# Patient Record
Sex: Female | Born: 1945 | Race: White | Hispanic: No | Marital: Married | Smoking: Former smoker
Health system: Southern US, Community
[De-identification: ages and names within clinical notes are randomized; demographics above are authoritative.]

## PROBLEM LIST (undated history)

## (undated) DIAGNOSIS — E7800 Pure hypercholesterolemia, unspecified: Secondary | ICD-10-CM

## (undated) DIAGNOSIS — G43909 Migraine, unspecified, not intractable, without status migrainosus: Secondary | ICD-10-CM

## (undated) DIAGNOSIS — F419 Anxiety disorder, unspecified: Secondary | ICD-10-CM

## (undated) DIAGNOSIS — I10 Essential (primary) hypertension: Secondary | ICD-10-CM

## (undated) DIAGNOSIS — J449 Chronic obstructive pulmonary disease, unspecified: Secondary | ICD-10-CM

## (undated) HISTORY — PX: BACK SURGERY: SHX140

## (undated) HISTORY — DX: Pure hypercholesterolemia, unspecified: E78.00

## (undated) HISTORY — PX: TOTAL ABDOMINAL HYSTERECTOMY: SHX209

## (undated) HISTORY — DX: Anxiety disorder, unspecified: F41.9

## (undated) HISTORY — DX: Migraine, unspecified, not intractable, without status migrainosus: G43.909

---

## 2008-06-12 ENCOUNTER — Emergency Department (HOSPITAL_COMMUNITY): Admission: EM | Admit: 2008-06-12 | Discharge: 2008-06-12 | Payer: Self-pay | Admitting: Emergency Medicine

## 2011-06-04 ENCOUNTER — Other Ambulatory Visit (HOSPITAL_COMMUNITY): Payer: Self-pay | Admitting: Family Medicine

## 2011-06-04 DIAGNOSIS — N6459 Other signs and symptoms in breast: Secondary | ICD-10-CM

## 2011-06-26 ENCOUNTER — Ambulatory Visit (HOSPITAL_COMMUNITY)
Admission: RE | Admit: 2011-06-26 | Discharge: 2011-06-26 | Disposition: A | Discharge status: Home / Self Care | Payer: Medicare Other | Source: Ambulatory Visit | Attending: Family Medicine | Admitting: Family Medicine

## 2011-06-26 DIAGNOSIS — N6459 Other signs and symptoms in breast: Secondary | ICD-10-CM

## 2011-06-26 DIAGNOSIS — N63 Unspecified lump in unspecified breast: Secondary | ICD-10-CM | POA: Insufficient documentation

## 2014-09-25 ENCOUNTER — Emergency Department: Payer: Self-pay | Admitting: Emergency Medicine

## 2014-10-10 ENCOUNTER — Ambulatory Visit (INDEPENDENT_AMBULATORY_CARE_PROVIDER_SITE_OTHER): Payer: 59 | Admitting: Diagnostic Neuroimaging

## 2014-10-10 ENCOUNTER — Encounter: Payer: Self-pay | Admitting: Diagnostic Neuroimaging

## 2014-10-10 VITALS — BP 125/81 | HR 86 | Temp 97.9°F | Ht 61.0 in | Wt 153.2 lb

## 2014-10-10 DIAGNOSIS — M5442 Lumbago with sciatica, left side: Secondary | ICD-10-CM

## 2014-10-10 DIAGNOSIS — M5416 Radiculopathy, lumbar region: Secondary | ICD-10-CM

## 2014-10-10 NOTE — Patient Instructions (Signed)
I will setup MRI and physical therapy.

## 2014-10-10 NOTE — Progress Notes (Signed)
[facility]  PATIENT: Samantha Carey DOB: 19-May-1946  REFERRING CLINICIAN: Coletta HISTORY FROM: patient and daughter  REASON FOR VISIT: new consult    HISTORICAL  CHIEF COMPLAINT:  Chief Complaint  Patient presents with  . New Evaluation    neuralgia in left leg     HISTORY OF PRESENT ILLNESS:   69 year old right-handed female here for evaluation of low back pain. April 2015 patient was lifting cinder blocks at home, stacking them 5 feet high, when all of a sudden she had significant low back pain radiating to her left leg. Patient has numbness and tingling from her low back, into the left leg down to the ankle. No significant symptoms in her toes or feet. Symptoms fluctuate throughout the day, typically in the early morning when she gets out of bed. Patient has not been able to do her normal activities of daily living such as lifting, shopping, driving, house chores. Patient went to the emergency room twice and also to walk-in clinic. She has been on gabapentin 300 mg 3 times a day, Norco 10/325 2 times a day, with mild relief. She previously was on tramadol which did not help. She has not had physical therapy. She had x-ray of the lower back in May 2015. No MRI or CT of the lumbar spine.  8 years ago patient had a different type of low back problem, when she was at work, bending and picking up things, leading to severe low back pain. At that time she did not have radiating pain symptoms.  No pain in her right leg. No bowel or bladder contents. No problems with her arms, neck or face.    REVIEW OF SYSTEMS: Full 14 system review of systems performed and notable only for anxiety. Otherwise negative.  ALLERGIES: Allergies  Allergen Reactions  . Morphine And Related Nausea And Vomiting    HOME MEDICATIONS: No outpatient prescriptions prior to visit.   No facility-administered medications prior to visit.    PAST MEDICAL HISTORY: Past Medical History    Diagnosis Date  . Anxiety   . Migraine   . Hypercholesteremia     PAST SURGICAL HISTORY: Past Surgical History  Procedure Laterality Date  . Total abdominal hysterectomy      FAMILY HISTORY: Family History  Problem Relation Age of Onset  . Stroke Mother   . Stroke Maternal Grandmother   . Diabetes Son   . Liver disease Father     alocholic liver cirrhosis  . Liver cancer Father     SOCIAL HISTORY:  History   Social History  . Marital Status: Married    Spouse Name: Hank    Number of Children: 3  . Years of Education: 8   Occupational History  . disability    Social History Main Topics  . Smoking status: Current Every Day Smoker -- 0.50 packs/day    Types: Cigarettes  . Smokeless tobacco: Never Used  . Alcohol Use: No  . Drug Use: No  . Sexual Activity: Not on file   Other Topics Concern  . Not on file   Social History Narrative   Lives with husband Hank but recently moved in with daughter to help with ADLs   Drinks a small amount of caffeine daily     PHYSICAL EXAM  Filed Vitals:   10/10/14 1052  BP: 125/81  Pulse: 86  Temp: 97.9 F (36.6 C)  TempSrc: Oral  Height:  (1.549 m)  Weight: 153 lb 3.2 oz (69.491  kg)    Body mass index is 28.96 kg/(m^2).   Visual Acuity Screening   Right eye Left eye Both eyes  Without correction: 20/40 20/30   With correction:       No flowsheet data found.  GENERAL EXAM: Patient is in no distress; well developed, nourished and groomed; neck is supple  CARDIOVASCULAR: Regular rate and rhythm, no murmurs, no carotid bruits  NEUROLOGIC: MENTAL STATUS: awake, alert, oriented to person, place and time, recent and remote memory intact, normal attention and concentration, language fluent, comprehension intact, naming intact, fund of knowledge appropriate CRANIAL NERVE: no papilledema on fundoscopic exam, pupils equal and reactive to light, visual fields full to confrontation, extraocular muscles intact,  no nystagmus, facial sensation and strength symmetric, hearing intact, palate elevates symmetrically, uvula midline, shoulder shrug symmetric, tongue midline. MOTOR: normal bulk and tone, full strength in the BUE, BLE SENSORY: normal and symmetric to light touch, pinprick, temperature, vibration; STRAIGHT LEG RAISE POSITIVE ON LEFT COORDINATION: finger-nose-finger, fine finger movements normal REFLEXES: deep tendon reflexes present and symmetric; DOWN GOING TOES GAIT/STATION: narrow based gait; able to walk on toes, heels and tandem; romberg is negative    DIAGNOSTIC DATA (LABS, IMAGING, TESTING) - I reviewed patient records, labs, notes, testing and imaging myself where available.  No results found for: WBC, HGB, HCT, MCV, PLT No results found for: NA, K, CL, CO2, GLUCOSE, BUN, CREATININE, CALCIUM, PROT, ALBUMIN, AST, ALT, ALKPHOS, BILITOT, GFRNONAA, GFRAA No results found for: CHOL, HDL, LDLCALC, LDLDIRECT, TRIG, CHOLHDL No results found for: ZOXW9UHGBA1C No results found for: VITAMINB12 No results found for: TSH     ASSESSMENT AND PLAN  69 y.o. year old female here with low back pain graded into the left leg. History and exam suspicious for left lumbar radiculopathy (L5, S1).   PLAN: - MRI lumbar spine - Physical therapy - Continue gabapentin and Norco; if patient will require long-term narcotics then will need to be managed by PCP or pain mgmt clinic   Orders Placed This Encounter  Procedures  . MR Lumbar Spine Wo Contrast  . Ambulatory referral to Physical Therapy   Return in about 3 months (around 01/08/2015).    Suanne MarkerVIKRAM R. Jashon Ishida, MD 10/10/2014, 11:42 AM Certified in Neurology, Neurophysiology and Neuroimaging  [facility] [address], [address] [place], [place] [address] [phone]

## 2014-10-24 ENCOUNTER — Ambulatory Visit
Admission: RE | Admit: 2014-10-24 | Discharge: 2014-10-24 | Disposition: A | Discharge status: Home / Self Care | Payer: Commercial Managed Care - HMO | Source: Ambulatory Visit | Attending: Diagnostic Neuroimaging | Admitting: Diagnostic Neuroimaging

## 2014-10-24 DIAGNOSIS — M5442 Lumbago with sciatica, left side: Secondary | ICD-10-CM | POA: Diagnosis not present

## 2014-10-24 DIAGNOSIS — M5416 Radiculopathy, lumbar region: Secondary | ICD-10-CM | POA: Diagnosis not present

## 2014-10-26 ENCOUNTER — Telehealth: Payer: Self-pay | Admitting: Diagnostic Neuroimaging

## 2014-10-26 DIAGNOSIS — M5416 Radiculopathy, lumbar region: Secondary | ICD-10-CM

## 2014-10-26 NOTE — Telephone Encounter (Signed)
i called patient's daughter with MRI results. Left L4 and L5 pinched nerves likely. Patient having severe pain. Will setup surgical / pain referral with neurosurgery clinic. -VRP

## 2014-10-26 NOTE — Telephone Encounter (Signed)
Patient's daughter Marylene Landngela is calling to get MRI results. Patient is in a lot of pain. Please call. Thank you.

## 2015-01-09 ENCOUNTER — Ambulatory Visit: Payer: 59 | Admitting: Diagnostic Neuroimaging

## 2015-01-10 ENCOUNTER — Encounter: Payer: Self-pay | Admitting: Diagnostic Neuroimaging

## 2016-05-15 ENCOUNTER — Emergency Department (HOSPITAL_COMMUNITY): Payer: Medicare HMO

## 2016-05-15 ENCOUNTER — Encounter (HOSPITAL_COMMUNITY): Payer: Self-pay | Admitting: Emergency Medicine

## 2016-05-15 ENCOUNTER — Emergency Department (HOSPITAL_COMMUNITY)
Admission: EM | Admit: 2016-05-15 | Discharge: 2016-05-15 | Disposition: A | Discharge status: Home / Self Care | Payer: Medicare HMO | Attending: Emergency Medicine | Admitting: Emergency Medicine

## 2016-05-15 DIAGNOSIS — Z79899 Other long term (current) drug therapy: Secondary | ICD-10-CM | POA: Insufficient documentation

## 2016-05-15 DIAGNOSIS — F17210 Nicotine dependence, cigarettes, uncomplicated: Secondary | ICD-10-CM | POA: Diagnosis not present

## 2016-05-15 DIAGNOSIS — R079 Chest pain, unspecified: Secondary | ICD-10-CM | POA: Insufficient documentation

## 2016-05-15 DIAGNOSIS — Z791 Long term (current) use of non-steroidal anti-inflammatories (NSAID): Secondary | ICD-10-CM | POA: Insufficient documentation

## 2016-05-15 DIAGNOSIS — N390 Urinary tract infection, site not specified: Secondary | ICD-10-CM | POA: Diagnosis not present

## 2016-05-15 DIAGNOSIS — R05 Cough: Secondary | ICD-10-CM | POA: Diagnosis present

## 2016-05-15 LAB — COMPREHENSIVE METABOLIC PANEL
ALBUMIN: 3.9 g/dL (ref 3.5–5.0)
ALK PHOS: 81 U/L (ref 38–126)
ALT: 28 U/L (ref 14–54)
ANION GAP: 12 (ref 5–15)
AST: 18 U/L (ref 15–41)
BUN: 51 mg/dL — AB (ref 6–20)
CALCIUM: 9.5 mg/dL (ref 8.9–10.3)
CO2: 26 mmol/L (ref 22–32)
Chloride: 89 mmol/L — ABNORMAL LOW (ref 101–111)
Creatinine, Ser: 2.02 mg/dL — ABNORMAL HIGH (ref 0.44–1.00)
GFR calc Af Amer: 28 mL/min — ABNORMAL LOW (ref >60)
GFR calc non Af Amer: 24 mL/min — ABNORMAL LOW (ref >60)
GLUCOSE: 108 mg/dL — AB (ref 65–99)
Potassium: 3.3 mmol/L — ABNORMAL LOW (ref 3.5–5.1)
SODIUM: 127 mmol/L — AB (ref 135–145)
Total Bilirubin: 0.5 mg/dL (ref 0.3–1.2)
Total Protein: 6.6 g/dL (ref 6.5–8.1)

## 2016-05-15 LAB — CBC WITH DIFFERENTIAL/PLATELET
BASOS ABS: 0 10*3/uL (ref 0.0–0.1)
BASOS PCT: 0 %
EOS ABS: 0.2 10*3/uL (ref 0.0–0.7)
Eosinophils Relative: 1 %
HEMATOCRIT: 40.5 % (ref 36.0–46.0)
HEMOGLOBIN: 14 g/dL (ref 12.0–15.0)
Lymphocytes Relative: 19 %
Lymphs Abs: 3.7 10*3/uL (ref 0.7–4.0)
MCH: 30.8 pg (ref 26.0–34.0)
MCHC: 34.6 g/dL (ref 30.0–36.0)
MCV: 89 fL (ref 78.0–100.0)
Monocytes Absolute: 1.2 10*3/uL — ABNORMAL HIGH (ref 0.1–1.0)
Monocytes Relative: 6 %
NEUTROS ABS: 14.4 10*3/uL — AB (ref 1.7–7.7)
NEUTROS PCT: 74 %
Platelets: 264 10*3/uL (ref 150–400)
RBC: 4.55 MIL/uL (ref 3.87–5.11)
RDW: 12.6 % (ref 11.5–15.5)
WBC: 19.5 10*3/uL — AB (ref 4.0–10.5)

## 2016-05-15 LAB — URINE MICROSCOPIC-ADD ON

## 2016-05-15 LAB — URINALYSIS, ROUTINE W REFLEX MICROSCOPIC
BILIRUBIN URINE: NEGATIVE
GLUCOSE, UA: NEGATIVE mg/dL
Hgb urine dipstick: NEGATIVE
Ketones, ur: NEGATIVE mg/dL
NITRITE: NEGATIVE
PH: 6 (ref 5.0–8.0)
Protein, ur: NEGATIVE mg/dL
SPECIFIC GRAVITY, URINE: 1.01 (ref 1.005–1.030)

## 2016-05-15 LAB — TROPONIN I: Troponin I: <0.03 ng/mL (ref <0.03)

## 2016-05-15 MED ORDER — CEPHALEXIN 500 MG PO CAPS: 500.0000 mg | ORAL_CAPSULE | Freq: Four times a day (QID) | ORAL | 0 refills | Status: DC

## 2016-05-15 MED ORDER — SODIUM CHLORIDE 0.9 % IV BOLUS (SEPSIS): 1000.0000 mL | Freq: Once | INTRAVENOUS | Status: DC

## 2016-05-15 MED ORDER — SODIUM CHLORIDE 0.9 % IV BOLUS (SEPSIS)
1000.0000 mL | Freq: Once | INTRAVENOUS | Status: AC
  Administered 2016-05-15: 1000 mL via INTRAVENOUS

## 2016-05-15 MED ORDER — DEXTROSE 5 % IV SOLN
1.0000 g | Freq: Once | INTRAVENOUS | Status: AC
  Administered 2016-05-15: 1 g via INTRAVENOUS
  Filled 2016-05-15: qty 10

## 2016-05-15 MED ORDER — IPRATROPIUM-ALBUTEROL 0.5-2.5 (3) MG/3ML IN SOLN
3.0000 mL | Freq: Once | RESPIRATORY_TRACT | Status: AC
  Administered 2016-05-15: 3 mL via RESPIRATORY_TRACT
  Filled 2016-05-15: qty 3

## 2016-05-15 MED ORDER — ALBUTEROL SULFATE HFA 108 (90 BASE) MCG/ACT IN AERS: 1.0000 | INHALATION_SPRAY | Freq: Four times a day (QID) | RESPIRATORY_TRACT | 0 refills | Status: DC | PRN

## 2016-05-15 MED ORDER — SODIUM CHLORIDE 0.9 % IV SOLN
Freq: Once | INTRAVENOUS | Status: AC
  Administered 2016-05-15: 18:00:00 via INTRAVENOUS

## 2016-05-15 NOTE — ED Notes (Signed)
BP recycled 70/45,. Dr Adriana Simasook notified. Pt is asleep at this time

## 2016-05-15 NOTE — Discharge Instructions (Signed)
You have a urinary tract infection. Increase fluids. Prescription for antibiotic to start in the morning and inhaler.  No smoking.

## 2016-05-15 NOTE — ED Triage Notes (Addendum)
Pt reports non-productive cough,nausea, and intermittent chest discomfort x1 week. Pt reports chest discomfort is worse with movement. Pt denies chest pain at this time.

## 2016-05-15 NOTE — ED Provider Notes (Signed)
[facility] Provider Note   CSN: [id number] Arrival date & time: 05/15/16  1124  By signing my name below, I, Samantha Carey, attest that this documentation has been prepared under the direction and in the presence of Samantha HutchingBrian Jianni Batten, MD. Electronically Signed: Javier Dockerobert Ryan Carey, ER Scribe. 04/13/2016. 1:47 PM.   History   Chief Complaint Chief Complaint  Patient presents with  . Cough    HPI  HPI Comments: Samantha Carey is a 70 y.o. female who presents to the Emergency Department complaining of two weeks of cough productive of clear sputum, and this morning she developed a burning and squeezing chest pain. She endorses associated SOB and nausea. She smokes 1PPD. [place]. She has no past hx of heart issues. She has recent hx of back surgery. She does not use an inhaler at home.  No frank dysuria, flank pain, chills. Severity of symptoms is moderate.   Past Medical History:  Diagnosis Date  . Anxiety   . Hypercholesteremia   . Migraine     There are no active problems to display for this patient.   Past Surgical History:  Procedure Laterality Date  . BACK SURGERY    . TOTAL ABDOMINAL HYSTERECTOMY      OB History    No data available       Home Medications    Prior to Admission medications   Medication Sig Start Date End Date Taking? Authorizing Provider  ALPRAZolam Samantha Carey(XANAX) 1 MG tablet Take 1 mg by mouth 4 (four) times daily.  09/29/14  Yes Historical Provider, MD  atorvastatin (LIPITOR) 80 MG tablet Take 1 tablet by mouth daily. 04/23/16  Yes Historical Provider, MD  buPROPion (WELLBUTRIN XL) 300 MG 24 hr tablet Take 1 tablet by mouth daily. 04/23/16  Yes Historical Provider, MD  Cholecalciferol (VITAMIN D3) 50000 units CAPS Take 1 capsule by mouth every Monday, Wednesday, and Friday. 03/07/16  Yes Historical Provider, MD  citalopram (CELEXA) 40 MG tablet Take 1 tablet by mouth daily. 05/02/16  Yes Historical Provider, MD  fluticasone  (FLONASE) 50 MCG/ACT nasal spray Place 2 sprays into both nostrils daily.  09/12/14  Yes Historical Provider, MD  gabapentin (NEURONTIN) 800 MG tablet Take 1 tablet by mouth 3 (three) times daily. 03/01/16  Yes Historical Provider, MD  HYDROcodone-acetaminophen (NORCO) 10-325 MG tablet Take 1 tablet by mouth 4 (four) times daily. 05/02/16  Yes Historical Provider, MD  ibuprofen (ADVIL,MOTRIN) 800 MG tablet Take 800 mg by mouth 3 (three) times daily as needed for moderate pain.  08/25/14  Yes Historical Provider, MD  lisinopril-hydrochlorothiazide (PRINZIDE,ZESTORETIC) 10-12.5 MG tablet Take 1 tablet by mouth daily. 05/02/16  Yes Historical Provider, MD  metoprolol (TOPROL-XL) 200 MG 24 hr tablet Take 0.5 tablets by mouth daily. 05/02/16  Yes Historical Provider, MD  albuterol (PROVENTIL HFA;VENTOLIN HFA) 108 (90 Base) MCG/ACT inhaler Inhale 1-2 puffs into the lungs every 6 (six) hours as needed for wheezing or shortness of breath. 05/15/16   Samantha HutchingBrian Colburn Asper, MD  cephALEXin (KEFLEX) 500 MG capsule Take 1 capsule (500 mg total) by mouth 4 (four) times daily. 05/15/16   Samantha HutchingBrian Samantha Ruffolo, MD    Family History Family History  Problem Relation Age of Onset  . Stroke Mother   . Liver disease Father     alocholic liver cirrhosis  . Liver cancer Father   . Stroke Maternal Grandmother   . Diabetes Son     Social History Social History  Substance Use Topics  .  Smoking status: Current Every Day Smoker    Packs/day: 0.50    Types: Cigarettes  . Smokeless tobacco: Never Used  . Alcohol use No     Allergies   Morphine and related   Review of Systems Review of Systems  Constitutional: Negative for chills and fever.  HENT: Positive for congestion.   Respiratory: Positive for cough and shortness of breath.   Cardiovascular: Positive for chest pain. Negative for palpitations.  Gastrointestinal: Positive for nausea.     Physical Exam Updated Vital Signs BP 93/59   Pulse (!) 57   Temp 98.2 F (36.8 C)  (Oral)   Resp 11   Ht 5\' 1"  (1.549 m)   Wt 140 lb (63.5 kg)   SpO2 94%   BMI 26.45 kg/m   Physical Exam  Constitutional: She is oriented to person, place, and time. She appears well-developed and well-nourished.  No obvious dyspnea, tachypnea.  HENT:  Head: Normocephalic and atraumatic.  Eyes: Conjunctivae are normal.  Neck: Neck supple.  Cardiovascular: Normal rate and regular rhythm.   Pulmonary/Chest: Effort normal and breath sounds normal.  Scattered rhonchi.   Abdominal: Soft. Bowel sounds are normal.  Musculoskeletal: Normal range of motion.  Neurological: She is alert and oriented to person, place, and time.  Skin: Skin is warm and dry.  Psychiatric: She has a normal mood and affect. Her behavior is normal.  Nursing note and vitals reviewed.    ED Treatments / Results  Labs (all labs ordered are listed, but only abnormal results are displayed) Labs Reviewed  CBC WITH DIFFERENTIAL/PLATELET - Abnormal; Notable for the following:       Result Value   WBC 19.5 (*)    Neutro Abs 14.4 (*)    Monocytes Absolute 1.2 (*)    All other components within normal limits  COMPREHENSIVE METABOLIC PANEL - Abnormal; Notable for the following:    Sodium 127 (*)    Potassium 3.3 (*)    Chloride 89 (*)    Glucose, Bld 108 (*)    BUN 51 (*)    Creatinine, Ser 2.02 (*)    GFR calc non Af Amer 24 (*)    GFR calc Af Amer 28 (*)    All other components within normal limits  URINALYSIS, ROUTINE W REFLEX MICROSCOPIC (NOT AT [facility]) - Abnormal; Notable for the following:    Leukocytes, UA MODERATE (*)    All other components within normal limits  URINE MICROSCOPIC-ADD ON - Abnormal; Notable for the following:    Squamous Epithelial / LPF 6-30 (*)    Bacteria, UA MANY (*)    All other components within normal limits  URINE CULTURE  TROPONIN I    EKG  EKG Interpretation  Date/Time:  Wednesday May 15 2016 13:33:15 EDT Ventricular Rate:  58 PR Interval:    QRS  Duration: 96 QT Interval:  398 QTC Calculation: 391 R Axis:   53 Text Interpretation:  Sinus rhythm Anteroseptal infarct, age indeterminate Confirmed by Hanna Aultman  MD, Kenton Fortin ([id number]) on 05/15/2016 2:46:29 PM       Radiology Dg Chest 2 View  Result Date: 05/15/2016 CLINICAL DATA:  Mid chest pain and difficulty swallowing. EXAM: CHEST  2 VIEW COMPARISON:  06/12/2008 FINDINGS: The heart size and mediastinal contours are within normal limits. Both lungs are clear. The visualized skeletal structures are unremarkable. IMPRESSION: No active cardiopulmonary disease. Electronically Signed   By: Richarda Overlie M.D.   On: 05/15/2016 12:00    Procedures Procedures (including  critical care time)  Medications Ordered in ED Medications  sodium chloride 0.9 % bolus 1,000 mL (0 mLs Intravenous Stopped 05/15/16 1536)  ipratropium-albuterol (DUONEB) 0.5-2.5 (3) MG/3ML nebulizer solution 3 mL (3 mLs Nebulization Given 05/15/16 1447)  sodium chloride 0.9 % bolus 1,000 mL (0 mLs Intravenous Stopped 05/15/16 1625)  cefTRIAXone (ROCEPHIN) 1 g in dextrose 5 % 50 mL IVPB (1 g Intravenous New Bag/Given 05/15/16 1829)  0.9 %  sodium chloride infusion ( Intravenous New Bag/Given 05/15/16 1829)     Initial Impression / Assessment and Plan / ED Course  I have reviewed the triage vital signs and the nursing notes.  Pertinent labs & imaging results that were available during my care of the patient were reviewed by me and considered in my medical decision making (see chart for details).  Clinical Course  CRITICAL CARE Performed by: Samantha Carey Total critical care time: 30 minutes Critical care time was exclusive of separately billable procedures and treating other patients. Critical care was necessary to treat or prevent imminent or life-threatening deterioration. Critical care was time spent personally by me on the following activities: development of treatment plan with patient and/or surrogate as well as nursing, discussions  with consultants, evaluation of patient's response to treatment, examination of patient, obtaining history from patient or surrogate, ordering and performing treatments and interventions, ordering and review of laboratory studies, ordering and review of radiographic studies, pulse oximetry and re-evaluation of patient's condition.  Patient was initially hypotensive. She feels better after 2-1/2 L of IV fluids. Chest x-ray showed no pneumonia, but she has a obvious urinary infection. IV Rocephin given. Urine culture. Discharge medication Keflex 500 mg. Encouraged to stop smoking.  Final Clinical Impressions(s) / ED Diagnoses   Final diagnoses:  UTI (lower urinary tract infection)    New Prescriptions New Prescriptions   ALBUTEROL (PROVENTIL HFA;VENTOLIN HFA) 108 (90 BASE) MCG/ACT INHALER    Inhale 1-2 puffs into the lungs every 6 (six) hours as needed for wheezing or shortness of breath.   CEPHALEXIN (KEFLEX) 500 MG CAPSULE    Take 1 capsule (500 mg total) by mouth 4 (four) times daily.     I personally performed the services described in this documentation, which was scribed in my presence. The recorded information has been reviewed and is accurate.          Samantha Hutching, MD 05/15/16 1929

## 2016-05-17 LAB — URINE CULTURE

## 2016-08-27 ENCOUNTER — Encounter (HOSPITAL_COMMUNITY): Payer: Self-pay | Admitting: Emergency Medicine

## 2016-08-27 ENCOUNTER — Emergency Department (HOSPITAL_COMMUNITY)
Admission: EM | Admit: 2016-08-27 | Discharge: 2016-08-27 | Disposition: A | Discharge status: Home / Self Care | Payer: Medicare HMO | Attending: Emergency Medicine | Admitting: Emergency Medicine

## 2016-08-27 DIAGNOSIS — Z79899 Other long term (current) drug therapy: Secondary | ICD-10-CM | POA: Diagnosis not present

## 2016-08-27 DIAGNOSIS — M545 Low back pain, unspecified: Secondary | ICD-10-CM

## 2016-08-27 DIAGNOSIS — F17210 Nicotine dependence, cigarettes, uncomplicated: Secondary | ICD-10-CM | POA: Diagnosis not present

## 2016-08-27 DIAGNOSIS — G8929 Other chronic pain: Secondary | ICD-10-CM | POA: Diagnosis not present

## 2016-08-27 MED ORDER — PREDNISONE 50 MG PO TABS
60.0000 mg | ORAL_TABLET | Freq: Once | ORAL | Status: AC
  Administered 2016-08-27: 60 mg via ORAL
  Filled 2016-08-27: qty 1

## 2016-08-27 MED ORDER — PREDNISONE 10 MG PO TABS: ORAL_TABLET | ORAL | 0 refills | Status: DC

## 2016-08-27 MED ORDER — KETOROLAC TROMETHAMINE 60 MG/2ML IM SOLN
30.0000 mg | Freq: Once | INTRAMUSCULAR | Status: AC
  Administered 2016-08-27: 30 mg via INTRAMUSCULAR
  Filled 2016-08-27: qty 2

## 2016-08-27 NOTE — ED Triage Notes (Signed)
Pt reports chronic back pain for the past 3 years. Pt states she has chronic pain that is unrelieved by her medication.

## 2016-08-27 NOTE — ED Provider Notes (Signed)
[facility] Provider Note   CSN: [id number] Arrival date & time: 08/27/16  1646     History   Chief Complaint Chief Complaint  Patient presents with  . Back Pain    HPI Samantha Carey is a 70 y.o. female presenting for evaluation of acute on chronic low back pain.  She endorses injuring her back when she was lifting heavy cinder blocks several years ago resulting in eventual surgery by Dr. Dutch QuintPoole x 2, since then has been evaluated by Dr. Murray HodgkinsBartko and recommended an implanted nerve stimulator for her chronic pain which in her lower back and radiates to bilateral buttocks.  She is afraid of this and just wants "her nerves burnt".  In the interim, her pain medicine is provided by her pcp but she states it is not contolling her pain.  She denies any new injury.  There has been no weakness or numbness in the lower extremities and no urinary or bowel retention or incontinence.  Patient does not have a history of cancer or IVDU.  She is currently taking oxycodone 10/325. Her pcp is not aware of her uncontrolled pain.  She has not followed up with Dr Murray HodgkinsBartko since he suggested the nerve stimulator.   The history is provided by the patient and the spouse.    Past Medical History:  Diagnosis Date  . Anxiety   . Hypercholesteremia   . Migraine     There are no active problems to display for this patient.   Past Surgical History:  Procedure Laterality Date  . BACK SURGERY    . TOTAL ABDOMINAL HYSTERECTOMY      OB History    Gravida Para Term Preterm AB Living   3 3 3          SAB TAB Ectopic Multiple Live Births                   Home Medications    Prior to Admission medications   Medication Sig Start Date End Date Taking? Authorizing Provider  albuterol (PROVENTIL HFA;VENTOLIN HFA) 108 (90 Base) MCG/ACT inhaler Inhale 1-2 puffs into the lungs every 6 (six) hours as needed for wheezing or shortness of breath. 05/15/16   Donnetta HutchingBrian Cook, MD  ALPRAZolam Prudy Feeler(XANAX) 1 MG tablet Take 1 mg  by mouth 4 (four) times daily.  09/29/14   Historical Provider, MD  atorvastatin (LIPITOR) 80 MG tablet Take 1 tablet by mouth daily. 04/23/16   Historical Provider, MD  buPROPion (WELLBUTRIN XL) 300 MG 24 hr tablet Take 1 tablet by mouth daily. 04/23/16   Historical Provider, MD  cephALEXin (KEFLEX) 500 MG capsule Take 1 capsule (500 mg total) by mouth 4 (four) times daily. 05/15/16   Donnetta HutchingBrian Cook, MD  Cholecalciferol (VITAMIN D3) 50000 units CAPS Take 1 capsule by mouth every Monday, Wednesday, and Friday. 03/07/16   Historical Provider, MD  citalopram (CELEXA) 40 MG tablet Take 1 tablet by mouth daily. 05/02/16   Historical Provider, MD  fluticasone (FLONASE) 50 MCG/ACT nasal spray Place 2 sprays into both nostrils daily.  09/12/14   Historical Provider, MD  gabapentin (NEURONTIN) 800 MG tablet Take 1 tablet by mouth 3 (three) times daily. 03/01/16   Historical Provider, MD  HYDROcodone-acetaminophen (NORCO) 10-325 MG tablet Take 1 tablet by mouth 4 (four) times daily. 05/02/16   Historical Provider, MD  ibuprofen (ADVIL,MOTRIN) 800 MG tablet Take 800 mg by mouth 3 (three) times daily as needed for moderate pain.  08/25/14   Historical Provider, MD  lisinopril-hydrochlorothiazide (PRINZIDE,ZESTORETIC) 10-12.5 MG tablet Take 1 tablet by mouth daily. 05/02/16   Historical Provider, MD  metoprolol (TOPROL-XL) 200 MG 24 hr tablet Take 0.5 tablets by mouth daily. 05/02/16   Historical Provider, MD  predniSONE (DELTASONE) 10 MG tablet 6, 5, 4, 3, 2 then 1 tablet by mouth daily for 6 days total. 08/27/16   Burgess Amor, PA-C    Family History Family History  Problem Relation Age of Onset  . Stroke Mother   . Liver disease Father     alocholic liver cirrhosis  . Liver cancer Father   . Stroke Maternal Grandmother   . Diabetes Son     Social History Social History  Substance Use Topics  . Smoking status: Current Every Day Smoker    Packs/day: 0.50    Types: Cigarettes  . Smokeless tobacco: Never Used  .  Alcohol use No     Allergies   Morphine and related   Review of Systems Review of Systems  Constitutional: Negative for fever.  Respiratory: Negative for shortness of breath.   Cardiovascular: Negative for chest pain and leg swelling.  Gastrointestinal: Negative for abdominal distention, abdominal pain and constipation.  Genitourinary: Negative for difficulty urinating, dysuria, flank pain, frequency and urgency.  Musculoskeletal: Positive for back pain. Negative for gait problem and joint swelling.  Skin: Negative for rash.  Neurological: Negative for weakness and numbness.     Physical Exam Updated Vital Signs BP 147/93 (BP Location: Left Arm)   Pulse 81   Temp 97.7 F (36.5 C) (Oral)   Resp 14   Ht 5\' 1"  (1.549 m)   Wt 59 kg   SpO2 98%   BMI 24.56 kg/m   Physical Exam  Constitutional: She appears well-developed and well-nourished.  HENT:  Head: Normocephalic.  Eyes: Conjunctivae are normal.  Neck: Normal range of motion. Neck supple.  Cardiovascular: Normal rate and intact distal pulses.   Pedal pulses normal.  Pulmonary/Chest: Effort normal.  Abdominal: Soft. She exhibits no distension and no mass.  Musculoskeletal: Normal range of motion. She exhibits no edema.       Lumbar back: She exhibits tenderness. She exhibits no swelling, no edema and no spasm.  ttp midline lumbar.  Well healed central midline surgical incision.  Neurological: She is alert. She has normal strength. She displays no atrophy and no tremor. No sensory deficit. Gait normal.  Reflex Scores:      Patellar reflexes are 2+ on the right side and 2+ on the left side.      Achilles reflexes are 2+ on the right side and 2+ on the left side. No strength deficit noted in hip and knee flexor and extensor muscle groups.  Ankle flexion and extension intact.  Skin: Skin is warm and dry.  Psychiatric: She has a normal mood and affect.  Nursing note and vitals reviewed.    ED Treatments / Results    Labs (all labs ordered are listed, but only abnormal results are displayed) Labs Reviewed - No data to display  EKG  EKG Interpretation None       Radiology No results found.  Procedures Procedures (including critical care time)  Medications Ordered in ED Medications  ketorolac (TORADOL) injection 30 mg (not administered)  predniSONE (DELTASONE) tablet 60 mg (not administered)     Initial Impression / Assessment and Plan / ED Course  I have reviewed the triage vital signs and the nursing notes.  Pertinent labs & imaging results that were available during  my care of the patient were reviewed by me and considered in my medical decision making (see chart for details).  Clinical Course     [place] narcotic database reviewed.  Pt receives hydrocodone 10 mg tabs #120 monthly from pcp, last filled 12/6.  Also takes alprazolam 1 mg tabs qid, last filled 11/30 #360 tabs.    Pt unhappy with her current pain management care.  Does not want to be on pills, wants her back fixed.  Tried to describe to her how a nerve stimulator can help and she should talk further to Dr Murray HodgkinsBartko about this.  She wants her nerves "burned".  Advised to f/u with her pain specialist - cannot assist in the ed.  She wants second opinion, given Dr. Ethelene Halamos info - she may need her pcp to refer however.    Pt was given toradol injection, will give a prednisone taper.    No neuro deficit on exam or by history to suggest emergent or surgical presentation.  discussed worsened sx that should prompt immediate re-evaluation including distal weakness, bowel/bladder retention/incontinence.        Final Clinical Impressions(s) / ED Diagnoses   Final diagnoses:  Chronic bilateral low back pain without sciatica    New Prescriptions New Prescriptions   PREDNISONE (DELTASONE) 10 MG TABLET    6, 5, 4, 3, 2 then 1 tablet by mouth daily for 6 days total.     Burgess AmorJulie Miaa Latterell, PA-C 08/28/16 1308    Vanetta MuldersScott Zackowski,  MD 08/29/16 [phone]

## 2016-10-08 ENCOUNTER — Other Ambulatory Visit: Payer: Self-pay | Admitting: *Deleted

## 2016-10-08 ENCOUNTER — Ambulatory Visit (INDEPENDENT_AMBULATORY_CARE_PROVIDER_SITE_OTHER): Payer: Medicare HMO | Admitting: Orthopaedic Surgery

## 2016-10-08 ENCOUNTER — Encounter: Payer: Self-pay | Admitting: Orthopaedic Surgery

## 2016-10-08 VITALS — BP 138/84 | HR 76 | Temp 97.8°F | Ht 60.0 in | Wt 150.0 lb

## 2016-10-08 DIAGNOSIS — M6530 Trigger finger, unspecified finger: Secondary | ICD-10-CM

## 2016-10-08 NOTE — Progress Notes (Signed)
The patient has been referred to me for right ring finger trigger release  She's a catching and locking for over a year she's had 2 injections without improvement she would like to have the trigger finger released  Infection rate less than half percent recurrence rate less than 2%  Patient aware of complications wrist and associated problems associated with the surgery agrees to proceed with surgery and will be scheduled for right ring finger trigger release.

## 2016-10-08 NOTE — Progress Notes (Signed)
Subjective:    Patient ID: Samantha Carey, female    DOB: 10/30/1945, 71 y.o.   MRN: [id number]  HPI She has had triggering of the right dominant ring finger for many months.  She has been seen by [facility] and had injection of the finger.  It lasted about a month or so.  It is locking more and more now.  She puts a Band Aid on the PIP joint to keep it more extended.  Nothing else seems to help.  She is aware about outpatient surgery and that is why she is here today.  I will have Dr. Romeo AppleHarrison see her for possible outpatient procedure.   Review of Systems  HENT: Negative for congestion.   Respiratory: Negative for cough and shortness of breath.   Cardiovascular: Negative for chest pain and leg swelling.  Endocrine: Positive for cold intolerance.  Musculoskeletal: Positive for arthralgias and back pain.  Allergic/Immunologic: Positive for environmental allergies.  Psychiatric/Behavioral: The patient is nervous/anxious.    Past Medical History:  Diagnosis Date  . Anxiety   . Hypercholesteremia   . Migraine     Past Surgical History:  Procedure Laterality Date  . BACK SURGERY    . TOTAL ABDOMINAL HYSTERECTOMY      Current Outpatient Prescriptions on File Prior to Visit  Medication Sig Dispense Refill  . albuterol (PROVENTIL HFA;VENTOLIN HFA) 108 (90 Base) MCG/ACT inhaler Inhale 1-2 puffs into the lungs every 6 (six) hours as needed for wheezing or shortness of breath. 1 Inhaler 0  . ALPRAZolam (XANAX) 1 MG tablet Take 1 mg by mouth 4 (four) times daily.   5  . atorvastatin (LIPITOR) 80 MG tablet Take 1 tablet by mouth daily.    Marland Kitchen. buPROPion (WELLBUTRIN XL) 300 MG 24 hr tablet Take 1 tablet by mouth daily.    . cephALEXin (KEFLEX) 500 MG capsule Take 1 capsule (500 mg total) by mouth 4 (four) times daily. 28 capsule 0  . Cholecalciferol (VITAMIN D3) 50000 units CAPS Take 1 capsule by mouth every Monday, Wednesday, and Friday.  2  . citalopram (CELEXA) 40 MG tablet Take 1 tablet by  mouth daily.    . fluticasone (FLONASE) 50 MCG/ACT nasal spray Place 2 sprays into both nostrils daily.     Marland Kitchen. gabapentin (NEURONTIN) 800 MG tablet Take 1 tablet by mouth 3 (three) times daily.    Marland Kitchen. HYDROcodone-acetaminophen (NORCO) 10-325 MG tablet Take 1 tablet by mouth 4 (four) times daily.    Marland Kitchen. ibuprofen (ADVIL,MOTRIN) 800 MG tablet Take 800 mg by mouth 3 (three) times daily as needed for moderate pain.     Marland Kitchen. lisinopril-hydrochlorothiazide (PRINZIDE,ZESTORETIC) 10-12.5 MG tablet Take 1 tablet by mouth daily.    . metoprolol (TOPROL-XL) 200 MG 24 hr tablet Take 0.5 tablets by mouth daily.    . predniSONE (DELTASONE) 10 MG tablet 6, 5, 4, 3, 2 then 1 tablet by mouth daily for 6 days total. 21 tablet 0   No current facility-administered medications on file prior to visit.     Social History   Social History  . Marital status: Married    Spouse name: Hank  . Number of children: 3  . Years of education: 8   Occupational History  . disability    Social History Main Topics  . Smoking status: Current Every Day Smoker    Packs/day: 0.50    Types: Cigarettes  . Smokeless tobacco: Never Used  . Alcohol use No  . Drug use: No  .  Sexual activity: Not on file   Other Topics Concern  . Not on file   Social History Narrative   Lives with husband Hank but recently moved in with daughter to help with ADLs   Drinks a small amount of caffeine daily    Family History  Problem Relation Age of Onset  . Stroke Mother   . Liver disease Father     alocholic liver cirrhosis  . Liver cancer Father   . Stroke Maternal Grandmother   . Diabetes Son     BP 138/84   Pulse 76   Temp 97.8 F (36.6 C)   Ht 5' (1.524 m)   Wt 150 lb (68 kg)   BMI 29.29 kg/m       Objective:   Physical Exam  Constitutional: She is oriented to person, place, and time. She appears well-developed and well-nourished.  HENT:  Head: Normocephalic and atraumatic.  Eyes: Conjunctivae and EOM are normal. Pupils  are equal, round, and reactive to light.  Neck: Normal range of motion. Neck supple.  Cardiovascular: Normal rate, regular rhythm and intact distal pulses.   Pulmonary/Chest: Effort normal.  Abdominal: Soft.  Musculoskeletal: She exhibits tenderness (Tender right ring finger at Al pulley.  I cannot get the finger to lock this morning.  NV intact.  Rest of hand and left hand negative.).  Neurological: She is alert and oriented to person, place, and time. She displays normal reflexes. No cranial nerve deficit. She exhibits normal muscle tone. Coordination normal.  Skin: Skin is warm and dry.  Psychiatric: She has a normal mood and affect. Her behavior is normal. Judgment and thought content normal.  Vitals reviewed.         Assessment & Plan:   Encounter Diagnosis  Name Primary?  . Trigger finger, acquired Yes   To see Dr. Romeo Apple for possible outpatient procedure.  Electronically Signed Darreld Mclean, MD 2/6/20188:40 AM

## 2016-10-17 ENCOUNTER — Encounter: Payer: Self-pay | Admitting: *Deleted

## 2016-10-17 NOTE — Progress Notes (Signed)
Per patient plan, no pre authorization is required for CPT 26055  REFERENCE  [id number]

## 2016-10-21 NOTE — Patient Instructions (Addendum)
    Samantha Carey  10/21/2016     @PREFPERIOPPHARMACY @   Your procedure is scheduled on 10/24/2016.  [name]  Call this number if you have problems the morning of surgery:  [phone]   Remember:  Do not eat food or drink liquids after midnight.  Take these medicines the morning of surgery with A SIP OF WATER : Xanax, Wellbutrin, Neurontin, Metoprolol, Oxycodone and Maxide.  Please use your Inhaler and nebulizers before leaving home and bring your inhaler with you to the hospital.   Do not wear jewelry, make-up or nail polish.  Do not wear lotions, powders, or perfumes, or deoderant.  Do not shave 48 hours prior to surgery.  Men may shave face and neck.  Do not bring valuables to the hospital.  [facility] any belongings or valuables.  Contacts, dentures or bridgework may not be worn into surgery.  Leave your suitcase in the car.  After surgery it may be brought to your room.  For patients admitted to the hospital, discharge time will be determined by your treatment team.  Patients discharged the day of surgery will not be allowed to drive home.   Name and phone number of your driver:   family Special instructions:  n/a  Please read over the following fact sheets that you were given. Care and Recovery After Surgery

## 2016-10-22 ENCOUNTER — Encounter (HOSPITAL_COMMUNITY)
Admission: RE | Admit: 2016-10-22 | Discharge: 2016-10-22 | Disposition: A | Discharge status: Home / Self Care | Payer: Medicare HMO | Source: Ambulatory Visit | Attending: Orthopedic Surgery | Admitting: Orthopedic Surgery

## 2016-10-22 ENCOUNTER — Encounter (HOSPITAL_COMMUNITY): Payer: Self-pay

## 2016-10-22 DIAGNOSIS — Z01818 Encounter for other preprocedural examination: Secondary | ICD-10-CM

## 2016-10-22 DIAGNOSIS — M79644 Pain in right finger(s): Secondary | ICD-10-CM | POA: Diagnosis present

## 2016-10-22 DIAGNOSIS — J449 Chronic obstructive pulmonary disease, unspecified: Secondary | ICD-10-CM | POA: Diagnosis not present

## 2016-10-22 DIAGNOSIS — Z79891 Long term (current) use of opiate analgesic: Secondary | ICD-10-CM | POA: Diagnosis not present

## 2016-10-22 DIAGNOSIS — M65341 Trigger finger, right ring finger: Secondary | ICD-10-CM | POA: Diagnosis not present

## 2016-10-22 DIAGNOSIS — Z79899 Other long term (current) drug therapy: Secondary | ICD-10-CM | POA: Diagnosis not present

## 2016-10-22 DIAGNOSIS — F419 Anxiety disorder, unspecified: Secondary | ICD-10-CM | POA: Diagnosis not present

## 2016-10-22 DIAGNOSIS — Z7951 Long term (current) use of inhaled steroids: Secondary | ICD-10-CM | POA: Diagnosis not present

## 2016-10-22 DIAGNOSIS — Z87891 Personal history of nicotine dependence: Secondary | ICD-10-CM | POA: Diagnosis not present

## 2016-10-22 DIAGNOSIS — I10 Essential (primary) hypertension: Secondary | ICD-10-CM | POA: Diagnosis not present

## 2016-10-22 DIAGNOSIS — E7800 Pure hypercholesterolemia, unspecified: Secondary | ICD-10-CM | POA: Diagnosis not present

## 2016-10-22 HISTORY — DX: Chronic obstructive pulmonary disease, unspecified: J44.9

## 2016-10-22 HISTORY — DX: Essential (primary) hypertension: I10

## 2016-10-22 LAB — CBC WITH DIFFERENTIAL/PLATELET
Basophils Absolute: 0 10*3/uL (ref 0.0–0.1)
Basophils Relative: 0 %
EOS PCT: 3 %
Eosinophils Absolute: 0.2 10*3/uL (ref 0.0–0.7)
HCT: 39.9 % (ref 36.0–46.0)
Hemoglobin: 13.6 g/dL (ref 12.0–15.0)
LYMPHS ABS: 3 10*3/uL (ref 0.7–4.0)
LYMPHS PCT: 38 %
MCH: 30 pg (ref 26.0–34.0)
MCHC: 34.1 g/dL (ref 30.0–36.0)
MCV: 87.9 fL (ref 78.0–100.0)
Monocytes Absolute: 0.8 10*3/uL (ref 0.1–1.0)
Monocytes Relative: 10 %
Neutro Abs: 3.8 10*3/uL (ref 1.7–7.7)
Neutrophils Relative %: 49 %
PLATELETS: 274 10*3/uL (ref 150–400)
RBC: 4.54 MIL/uL (ref 3.87–5.11)
RDW: 12.5 % (ref 11.5–15.5)
WBC: 7.8 10*3/uL (ref 4.0–10.5)

## 2016-10-22 LAB — BASIC METABOLIC PANEL
Anion gap: 8 (ref 5–15)
BUN: 25 mg/dL — AB (ref 6–20)
CO2: 27 mmol/L (ref 22–32)
Calcium: 9.4 mg/dL (ref 8.9–10.3)
Chloride: 100 mmol/L — ABNORMAL LOW (ref 101–111)
Creatinine, Ser: 0.69 mg/dL (ref 0.44–1.00)
GFR calc Af Amer: >60 mL/min (ref >60)
GFR calc non Af Amer: >60 mL/min (ref >60)
GLUCOSE: 101 mg/dL — AB (ref 65–99)
POTASSIUM: 3.6 mmol/L (ref 3.5–5.1)
Sodium: 135 mmol/L (ref 135–145)

## 2016-10-22 NOTE — Progress Notes (Signed)
   10/22/16 0823  OBSTRUCTIVE SLEEP APNEA  Have you ever been diagnosed with sleep apnea through a sleep study? No  Do you snore loudly (loud enough to be heard through closed doors)?  1  Do you often feel tired, fatigued, or sleepy during the daytime (such as falling asleep during driving or talking to someone)? 1  Has anyone observed you stop breathing during your sleep? 0  Do you have, or are you being treated for high blood pressure? 1  BMI more than 35 kg/m2? 0  Age > 50 (1-yes) 1  Neck circumference greater than:Female 16 inches or larger, Female 17inches or larger? 0  Female Gender (Yes=1) 0  Obstructive Sleep Apnea Score 4  Score 5 or greater  Results sent to PCP

## 2016-10-23 NOTE — H&P (Signed)
Samantha Carey is an 71 y.o. female.   Chief Complaint: Right hand ring finger pain   History: She is 71 years old her right ring finger has been triggering for several months. She had an injection in the finger by or through [place] and it worked for about one month and then the triggering and pain increased. She tried placing a Band-Aid on the finger to stop it from triggering it did not help. She was seen by Dr. Luna Glasgow who referred her to me when she indicated she wanted to have surgery  Past Medical History:  Diagnosis Date  . Anxiety   . COPD (chronic obstructive pulmonary disease) ([place])   . Hypercholesteremia   . Hypertension   . Migraine     Past Surgical History:  Procedure Laterality Date  . BACK SURGERY     x2  . TOTAL ABDOMINAL HYSTERECTOMY      Family History  Problem Relation Age of Onset  . Stroke Mother   . Liver disease Father     alocholic liver cirrhosis  . Liver cancer Father   . Stroke Maternal Grandmother   . Diabetes Son    Social History:  reports that she quit smoking about 4 weeks ago. Her smoking use included Cigarettes. She quit after 20.00 years of use. She has never used smokeless tobacco. She reports that she does not drink alcohol or use drugs.  Allergies:  Allergies  Allergen Reactions  . Morphine And Related Nausea And Vomiting    No prescriptions prior to admission.    Results for orders placed or performed during the hospital encounter of 10/22/16 (from the past 48 hour(s))  CBC with Differential/Platelet     Status: None   Collection Time: 10/22/16  8:27 AM  Result Value Ref Range   WBC 7.8 4.0 - 10.5 K/uL   RBC 4.54 3.87 - 5.11 MIL/uL   Hemoglobin 13.6 12.0 - 15.0 g/dL   HCT 39.9 36.0 - 46.0 %   MCV 87.9 78.0 - 100.0 fL   MCH 30.0 26.0 - 34.0 pg   MCHC 34.1 30.0 - 36.0 g/dL   RDW 12.5 11.5 - 15.5 %   Platelets 274 150 - 400 K/uL   Neutrophils Relative % 49 %   Neutro Abs 3.8 1.7 - 7.7 K/uL   Lymphocytes Relative 38 %   Lymphs  Abs 3.0 0.7 - 4.0 K/uL   Monocytes Relative 10 %   Monocytes Absolute 0.8 0.1 - 1.0 K/uL   Eosinophils Relative 3 %   Eosinophils Absolute 0.2 0.0 - 0.7 K/uL   Basophils Relative 0 %   Basophils Absolute 0.0 0.0 - 0.1 K/uL  Basic metabolic panel     Status: Abnormal   Collection Time: 10/22/16  8:27 AM  Result Value Ref Range   Sodium 135 135 - 145 mmol/L   Potassium 3.6 3.5 - 5.1 mmol/L   Chloride 100 (L) 101 - 111 mmol/L   CO2 27 22 - 32 mmol/L   Glucose, Bld 101 (H) 65 - 99 mg/dL   BUN 25 (H) 6 - 20 mg/dL   Creatinine, Ser 0.69 0.44 - 1.00 mg/dL   Calcium 9.4 8.9 - 10.3 mg/dL   GFR calc non Af Amer >60 >60 mL/min   GFR calc Af Amer >60 >60 mL/min    Comment: (NOTE) The eGFR has been calculated using the CKD EPI equation. This calculation has not been validated in all clinical situations. eGFR's persistently <60 mL/min signify possible Chronic Kidney  Disease.    Anion gap 8 5 - 15   No results found.  Review of Systems  Constitutional: Negative for chills, fever and weight loss.  Respiratory: Negative for shortness of breath.   Cardiovascular: Negative for chest pain.  Neurological: Negative for tingling.  All other systems reviewed and are negative.    Physical Exam  Constitutional: She is oriented to person, place, and time. She appears well-nourished.  Eyes: Right eye exhibits no discharge. Left eye exhibits no discharge. No scleral icterus.  Neck: Neck supple. No JVD present. No tracheal deviation present.  Cardiovascular: Intact distal pulses.   Respiratory: Effort normal. No stridor.  GI: Soft. She exhibits no distension.  Musculoskeletal:  Right ring finger she has tenderness over the A1 pulley she has nodularity in the palm her flexion extension does not show triggering but mild catching the flexor tendons are normal and intact the sensory exam is normal color and capillary refill are normal  Neurological: She is alert and oriented to person, place, and time.  She has normal reflexes. She exhibits normal muscle tone. Coordination normal.  Skin: Skin is warm and dry. No rash noted. No erythema. No pallor.  Psychiatric: She has a normal mood and affect. Her behavior is normal. Thought content normal.     Assessment/Plan Right ring finger triggering/Teno-synovitis  Recommend right ring finger A1 pulley release  This procedure has been fully reviewed with the patient and written informed consent has been obtained.   Arther Abbott, MD 10/23/2016, 12:54 PM

## 2016-10-24 ENCOUNTER — Encounter (HOSPITAL_COMMUNITY): Payer: Self-pay | Admitting: *Deleted

## 2016-10-24 ENCOUNTER — Ambulatory Visit (HOSPITAL_COMMUNITY): Payer: Medicare HMO | Admitting: Anesthesiology

## 2016-10-24 ENCOUNTER — Encounter (HOSPITAL_COMMUNITY)
Admission: RE | Disposition: A | Discharge status: Home / Self Care | Payer: Self-pay | Source: Ambulatory Visit | Attending: Orthopedic Surgery

## 2016-10-24 ENCOUNTER — Ambulatory Visit (HOSPITAL_COMMUNITY)
Admission: RE | Admit: 2016-10-24 | Discharge: 2016-10-24 | Disposition: A | Discharge status: Home / Self Care | Payer: Medicare HMO | Source: Ambulatory Visit | Attending: Orthopedic Surgery | Admitting: Orthopedic Surgery

## 2016-10-24 DIAGNOSIS — M65341 Trigger finger, right ring finger: Secondary | ICD-10-CM | POA: Insufficient documentation

## 2016-10-24 DIAGNOSIS — E7800 Pure hypercholesterolemia, unspecified: Secondary | ICD-10-CM | POA: Insufficient documentation

## 2016-10-24 DIAGNOSIS — J449 Chronic obstructive pulmonary disease, unspecified: Secondary | ICD-10-CM | POA: Diagnosis not present

## 2016-10-24 DIAGNOSIS — Z7951 Long term (current) use of inhaled steroids: Secondary | ICD-10-CM | POA: Insufficient documentation

## 2016-10-24 DIAGNOSIS — I10 Essential (primary) hypertension: Secondary | ICD-10-CM | POA: Diagnosis not present

## 2016-10-24 DIAGNOSIS — Z79891 Long term (current) use of opiate analgesic: Secondary | ICD-10-CM | POA: Insufficient documentation

## 2016-10-24 DIAGNOSIS — Z87891 Personal history of nicotine dependence: Secondary | ICD-10-CM | POA: Insufficient documentation

## 2016-10-24 DIAGNOSIS — Z79899 Other long term (current) drug therapy: Secondary | ICD-10-CM | POA: Insufficient documentation

## 2016-10-24 DIAGNOSIS — F419 Anxiety disorder, unspecified: Secondary | ICD-10-CM | POA: Insufficient documentation

## 2016-10-24 HISTORY — PX: TRIGGER FINGER RELEASE: SHX641

## 2016-10-24 SURGERY — RELEASE, A1 PULLEY, FOR TRIGGER FINGER
Anesthesia: Regional | Site: Hand | Laterality: Right

## 2016-10-24 MED ORDER — CEFAZOLIN SODIUM-DEXTROSE 2-4 GM/100ML-% IV SOLN
2.0000 g | INTRAVENOUS | Status: AC
  Administered 2016-10-24: 2 g via INTRAVENOUS
  Filled 2016-10-24: qty 100

## 2016-10-24 MED ORDER — LIDOCAINE HCL (PF) 0.5 % IJ SOLN
INTRAMUSCULAR | Status: DC | PRN
  Administered 2016-10-24: 50 mL via INTRAVENOUS

## 2016-10-24 MED ORDER — LIDOCAINE HCL (PF) 0.5 % IJ SOLN
INTRAMUSCULAR | Status: AC
  Filled 2016-10-24: qty 50

## 2016-10-24 MED ORDER — PROPOFOL 10 MG/ML IV BOLUS
INTRAVENOUS | Status: AC
  Filled 2016-10-24: qty 20

## 2016-10-24 MED ORDER — BUPIVACAINE HCL (PF) 0.5 % IJ SOLN
INTRAMUSCULAR | Status: DC | PRN
  Administered 2016-10-24: 10 mL

## 2016-10-24 MED ORDER — PROPOFOL 500 MG/50ML IV EMUL
INTRAVENOUS | Status: DC | PRN
  Administered 2016-10-24: 50 ug/kg/min via INTRAVENOUS

## 2016-10-24 MED ORDER — MIDAZOLAM HCL 2 MG/2ML IJ SOLN
INTRAMUSCULAR | Status: AC
  Filled 2016-10-24: qty 2

## 2016-10-24 MED ORDER — IBUPROFEN 800 MG PO TABS: 800.0000 mg | ORAL_TABLET | Freq: Three times a day (TID) | ORAL | 1 refills | Status: DC | PRN

## 2016-10-24 MED ORDER — BUPIVACAINE HCL (PF) 0.5 % IJ SOLN
INTRAMUSCULAR | Status: AC
  Filled 2016-10-24: qty 30

## 2016-10-24 MED ORDER — FENTANYL CITRATE (PF) 100 MCG/2ML IJ SOLN
INTRAMUSCULAR | Status: AC
  Filled 2016-10-24: qty 2

## 2016-10-24 MED ORDER — CHLORHEXIDINE GLUCONATE 4 % EX LIQD: 60.0000 mL | Freq: Once | CUTANEOUS | Status: DC

## 2016-10-24 MED ORDER — MIDAZOLAM HCL 2 MG/2ML IJ SOLN
1.0000 mg | INTRAMUSCULAR | Status: AC
  Administered 2016-10-24 (×2): 2 mg via INTRAVENOUS
  Filled 2016-10-24: qty 2

## 2016-10-24 MED ORDER — LACTATED RINGERS IV SOLN
INTRAVENOUS | Status: DC
  Administered 2016-10-24 (×2): via INTRAVENOUS

## 2016-10-24 MED ORDER — MIDAZOLAM HCL 5 MG/5ML IJ SOLN
INTRAMUSCULAR | Status: DC | PRN
  Administered 2016-10-24: 1 mg via INTRAVENOUS

## 2016-10-24 MED ORDER — FENTANYL CITRATE (PF) 100 MCG/2ML IJ SOLN: 25.0000 ug | INTRAMUSCULAR | Status: DC | PRN

## 2016-10-24 MED ORDER — FENTANYL CITRATE (PF) 100 MCG/2ML IJ SOLN
25.0000 ug | Freq: Once | INTRAMUSCULAR | Status: AC
  Administered 2016-10-24: 25 ug via INTRAVENOUS

## 2016-10-24 MED ORDER — SODIUM CHLORIDE 0.9 % IR SOLN
Status: DC | PRN
  Administered 2016-10-24: 50 mL

## 2016-10-24 SURGICAL SUPPLY — 41 items
BAG HAMPER (MISCELLANEOUS) ×2 IMPLANT
BANDAGE ELASTIC 2 LF NS (GAUZE/BANDAGES/DRESSINGS) ×2 IMPLANT
BANDAGE ESMARK 4X12 BL STRL LF (DISPOSABLE) IMPLANT
BLADE SURG 15 STRL LF DISP TIS (BLADE) ×1 IMPLANT
BLADE SURG 15 STRL SS (BLADE) ×2
BNDG CMPR 12X4 ELC STRL LF (DISPOSABLE) ×1
BNDG CMPR MED 5X2 ELC HKLP NS (GAUZE/BANDAGES/DRESSINGS) ×1
BNDG CONFORM 2 STRL LF (GAUZE/BANDAGES/DRESSINGS) ×1 IMPLANT
BNDG ESMARK 4X12 BLUE STRL LF (DISPOSABLE) ×2
CHLORAPREP W/TINT 26ML (MISCELLANEOUS) ×2 IMPLANT
CLOTH BEACON ORANGE TIMEOUT ST (SAFETY) ×2 IMPLANT
COVER LIGHT HANDLE STERIS (MISCELLANEOUS) ×4 IMPLANT
CUFF TOURNIQUET SINGLE 18IN (TOURNIQUET CUFF) ×3 IMPLANT
CUFF TOURNIQUET SINGLE 24IN (TOURNIQUET CUFF) IMPLANT
DECANTER SPIKE VIAL GLASS SM (MISCELLANEOUS) ×2 IMPLANT
DRSG XEROFORM 1X8 (GAUZE/BANDAGES/DRESSINGS) ×1 IMPLANT
ELECT NDL TIP 2.8 STRL (NEEDLE) ×1 IMPLANT
ELECT NEEDLE TIP 2.8 STRL (NEEDLE) ×2 IMPLANT
ELECT REM PT RETURN 9FT ADLT (ELECTROSURGICAL) ×2
ELECTRODE REM PT RTRN 9FT ADLT (ELECTROSURGICAL) ×1 IMPLANT
GAUZE SPONGE 4X4 12PLY STRL (GAUZE/BANDAGES/DRESSINGS) ×1 IMPLANT
GLOVE BIOGEL PI IND STRL 7.0 (GLOVE) ×1 IMPLANT
GLOVE BIOGEL PI INDICATOR 7.0 (GLOVE) ×2
GLOVE SKINSENSE NS SZ8.0 LF (GLOVE) ×1
GLOVE SKINSENSE STRL SZ8.0 LF (GLOVE) ×1 IMPLANT
GLOVE SS N UNI LF 8.5 STRL (GLOVE) ×2 IMPLANT
GOWN STRL REUS W/ TWL LRG LVL3 (GOWN DISPOSABLE) ×1 IMPLANT
GOWN STRL REUS W/TWL LRG LVL3 (GOWN DISPOSABLE) ×3 IMPLANT
GOWN STRL REUS W/TWL XL LVL3 (GOWN DISPOSABLE) ×2 IMPLANT
HAND ALUMI XLG (SOFTGOODS) ×2 IMPLANT
KIT ROOM TURNOVER APOR (KITS) ×2 IMPLANT
MANIFOLD NEPTUNE II (INSTRUMENTS) ×2 IMPLANT
NDL HYPO 21X1.5 SAFETY (NEEDLE) IMPLANT
NEEDLE HYPO 21X1.5 SAFETY (NEEDLE) ×2 IMPLANT
NS IRRIG 1000ML POUR BTL (IV SOLUTION) ×2 IMPLANT
PACK BASIC LIMB (CUSTOM PROCEDURE TRAY) ×2 IMPLANT
PAD ARMBOARD 7.5X6 YLW CONV (MISCELLANEOUS) ×2 IMPLANT
SET BASIN LINEN APH (SET/KITS/TRAYS/PACK) ×2 IMPLANT
SPONGE GAUZE 2X2 8PLY STRL LF (GAUZE/BANDAGES/DRESSINGS) ×1 IMPLANT
SUT ETHILON 3 0 FSL (SUTURE) ×2 IMPLANT
SYR CONTROL 10ML LL (SYRINGE) ×1 IMPLANT

## 2016-10-24 NOTE — Anesthesia Preprocedure Evaluation (Signed)
Anesthesia Evaluation  Patient identified by MRN, date of birth, ID band Patient awake    Reviewed: Allergy & Precautions, NPO status , Patient's Chart, lab work & pertinent test results, reviewed documented beta blocker date and time   Airway Mallampati: II  TM Distance: >3 FB     Dental  (+) Teeth Intact   Pulmonary COPD,  COPD inhaler, former smoker,    breath sounds clear to auscultation       Cardiovascular hypertension, Pt. on medications and Pt. on home beta blockers  Rhythm:Regular Rate:Normal     Neuro/Psych  Headaches, Anxiety    GI/Hepatic negative GI ROS,   Endo/Other    Renal/GU      Musculoskeletal   Abdominal   Peds  Hematology   Anesthesia Other Findings   Reproductive/Obstetrics                             Anesthesia Physical Anesthesia Plan  ASA: III  Anesthesia Plan: Bier Block   Post-op Pain Management:    Induction: Intravenous  Airway Management Planned: Simple Face Mask  Additional Equipment:   Intra-op Plan:   Post-operative Plan:   Informed Consent: I have reviewed the patients History and Physical, chart, labs and discussed the procedure including the risks, benefits and alternatives for the proposed anesthesia with the patient or authorized representative who has indicated his/her understanding and acceptance.     Plan Discussed with:   Anesthesia Plan Comments:         Anesthesia Quick Evaluation

## 2016-10-24 NOTE — Discharge Instructions (Signed)
Make sure you are moving her fingers immediately bend all 4 fingers down and extend all 4 fingers out several times every hour  You are on oxycodone 20 mg already for pain. Have added ibuprofen 800 mg he can take for pain secondary to the surgery you can also take 1-2 Tylenol every 4-6 hours as needed for pain

## 2016-10-24 NOTE — Anesthesia Procedure Notes (Signed)
Anesthesia Regional Block: Bier block (IV Regional)   Pre-Anesthetic Checklist: ,, timeout performed, Correct Patient, Correct Site, Correct Laterality, Correct Procedure,, site marked, surgical consent,, at surgeon's request Needles:  Injection technique: Single-shot  Needle Type: Other      Needle Gauge: 22     Additional Needles:   Procedures:,,,,,,, Esmarch exsanguination, single tourniquet utilized,   Nerve Stimulator or Paresthesia:   Additional Responses:  Pulse checked post tourniquet inflation. IV NSL discontinued post injection. Narrative:  Start time: 10/24/2016 10:43 AM  Performed by: Personally

## 2016-10-24 NOTE — Anesthesia Postprocedure Evaluation (Signed)
Anesthesia Post Note  Patient: Samantha Carey  Procedure(s) Performed: Procedure(s) (LRB): RIGHT RING FINGER TRIGGER RELEASE (Right)  Patient location during evaluation: PACU Anesthesia Type: Bier Block Level of consciousness: awake and alert and oriented Pain management: satisfactory to patient Vital Signs Assessment: post-procedure vital signs reviewed and stable Respiratory status: spontaneous breathing Cardiovascular status: stable Postop Assessment: adequate PO intake     Last Vitals:  Vitals:   10/24/16 1115 10/24/16 1130  BP:  118/65  Pulse:  80  Resp: (P) 18 15  Temp:      Last Pain:  Vitals:   10/24/16 0833  TempSrc: Oral                 [name]

## 2016-10-24 NOTE — Transfer of Care (Signed)
Immediate Anesthesia Transfer of Care Note  Patient: Samantha ChandlerIona Carey  Procedure(s) Performed: Procedure(s): RIGHT RING FINGER TRIGGER RELEASE (Right)  Patient Location: PACU  Anesthesia Type:Bier block  Level of Consciousness: awake, alert  and oriented  Airway & Oxygen Therapy: Patient Spontanous Breathing and Patient connected to nasal cannula oxygen  Post-op Assessment: Report given to RN  Post vital signs: Reviewed and stable  Last Vitals:  Vitals:   10/24/16 1000 10/24/16 1015  BP: 117/63 122/67  Resp: 11 12  Temp:      Last Pain:  Vitals:   10/24/16 0833  TempSrc: Oral      Patients Stated Pain Goal: 5 (10/24/16 16100833)  Complications: No apparent anesthesia complications

## 2016-10-24 NOTE — Interval H&P Note (Signed)
History and Physical Interval Note:  10/24/2016 10:18 AM  Samantha Carey  has presented today for surgery, with the diagnosis of RIGHT RING TRIGGER FINGER  The various methods of treatment have been discussed with the patient and family. After consideration of risks, benefits and other options for treatment, the patient has consented to  Procedure(s): RELEASE TRIGGER FINGER/A-1 PULLEY (Right) as a surgical intervention .  The patient's history has been reviewed, patient examined, no change in status, stable for surgery.  I have reviewed the patient's chart and labs.  Questions were answered to the patient's satisfaction.     [name]

## 2016-10-24 NOTE — Brief Op Note (Signed)
10/24/2016  11:10 AM  PATIENT:  Samantha ChandlerIona Ruppert  71 y.o. female  PRE-OPERATIVE DIAGNOSIS:  RIGHT RING TRIGGER FINGER  POST-OPERATIVE DIAGNOSIS:  RIGHT RING TRIGGER FINGER  PROCEDURE:  Procedure(s): RIGHT RING FINGER TRIGGER RELEASE (Right)   Surgical findings stenosis of the flexor tendon of the right ring finger at the A1 pulley  How it was done  Site marking and was done in the preop area confirmation of right ring finger as a surgical site. Chart review was completed in this process  The patient was taken to the operating room. She was given Ancef 2 g IV for preoperative antibiotics. She had a successful Bier block without complication.  The arm was prepped and draped with ChloraPrep and timeout was taken to confirm the site and area  I made a longitudinal incision distal to the distal transverse palmar crease divided subcutaneous tissue and protected the neurovascular bundles. I passed a blunt Freer instrument under the A1 pulley and then released under direct visualization  The tendons were normal. I flexed the fingers to confirm adequate release and then irrigated the wound and closed with 4 3-0 nylon interrupted sutures  I did inject 10 mL of plain Marcaine for postoperative pain control  The tourniquet was released off the eye held pressure on the incision and capillary refill returned to normal as did color    SURGEON:  Surgeon(s) and Role:    * Vickki HearingStanley E Aleiya Rye, MD - Primary  PHYSICIAN ASSISTANT:   ASSISTANTS: none   ANESTHESIA:   regional  EBL:  Total I/O In: 200 [I.V.:200] Out: 0   BLOOD ADMINISTERED:none  DRAINS: none   LOCAL MEDICATIONS USED:  MARCAINE     SPECIMEN:  No Specimen  DISPOSITION OF SPECIMEN:  N/A  COUNTS:  YES  TOURNIQUET:   Total Tourniquet Time Documented: Upper Arm (Right) - 24 minutes Total: Upper Arm (Right) - 24 minutes   DICTATION: .Dragon Dictation  PLAN OF CARE: Discharge to home after PACU  PATIENT DISPOSITION:  PACU  - hemodynamically stable.   Delay start of Pharmacological VTE agent (>24hrs) due to surgical blood loss or risk of bleeding: not applicable  26055

## 2016-10-24 NOTE — Op Note (Signed)
Samantha Carey  Date of birth 1946/03/01  Surgery report    PRE-OPERATIVE DIAGNOSIS:  RIGHT RING TRIGGER FINGER 10/24/2016   POST-OPERATIVE DIAGNOSIS:  RIGHT RING TRIGGER FINGER  PROCEDURE:  Procedure(s): RIGHT RING FINGER TRIGGER RELEASE (Right)   Surgical findings stenosis of the flexor tendon of the right ring finger at the A1 pulley  How it was done  Site marking and was done in the preop area confirmation of right ring finger as a surgical site. Chart review was completed in this process  The patient was taken to the operating room. She was given Ancef 2 g IV for preoperative antibiotics. She had a successful Bier block without complication.  The arm was prepped and draped with ChloraPrep and timeout was taken to confirm the site and area  I made a longitudinal incision distal to the distal transverse palmar crease divided subcutaneous tissue and protected the neurovascular bundles. I passed a blunt Freer instrument under the A1 pulley and then released under direct visualization  The tendons were normal. I flexed the fingers to confirm adequate release and then irrigated the wound and closed with 4 3-0 nylon interrupted sutures  I did inject 10 mL of plain Marcaine for postoperative pain control  The tourniquet was released off the eye held pressure on the incision and capillary refill returned to normal as did color    SURGEON:  Surgeon(s) and Role:    * Vickki HearingStanley E Marke Goodwyn, MD - Primary  PHYSICIAN ASSISTANT:   ASSISTANTS: none   ANESTHESIA:   regional  EBL:  Total I/O In: 200 [I.V.:200] Out: 0   BLOOD ADMINISTERED:none  DRAINS: none   LOCAL MEDICATIONS USED:  MARCAINE     SPECIMEN:  No Specimen  DISPOSITION OF SPECIMEN:  N/A  COUNTS:  YES  TOURNIQUET:   Total Tourniquet Time Documented: Upper Arm (Right) - 24 minutes Total: Upper Arm (Right) - 24 minutes   DICTATION: .Dragon Dictation  PLAN OF CARE: Discharge to home after PACU  PATIENT  DISPOSITION:  PACU - hemodynamically stable.   Delay start of Pharmacological VTE agent (>24hrs) due to surgical blood loss or risk of bleeding: not applicable  26055

## 2016-10-28 ENCOUNTER — Ambulatory Visit (INDEPENDENT_AMBULATORY_CARE_PROVIDER_SITE_OTHER): Payer: Self-pay | Admitting: Orthopedic Surgery

## 2016-10-28 ENCOUNTER — Encounter: Payer: Self-pay | Admitting: Orthopedic Surgery

## 2016-10-28 DIAGNOSIS — Z4889 Encounter for other specified surgical aftercare: Secondary | ICD-10-CM

## 2016-10-28 DIAGNOSIS — Z9889 Other specified postprocedural states: Secondary | ICD-10-CM | POA: Insufficient documentation

## 2016-10-28 NOTE — Patient Instructions (Signed)
Work on exercises as instructed  Change bandage every day  Follow-up for suture removal

## 2016-10-28 NOTE — Progress Notes (Signed)
Patient ID: Samantha Carey, female   DOB: 06/21/1946, 71 y.o.   MRN: [id number]  Follow up visit/  POST OP   Chief Complaint  Patient presents with  . Follow-up    Post op recheck on right ring finger, trigger finger, DOS 10-24-16.    4 days postop right ring finger trigger The incision looks clean dry and intact she can almost make a full fist  Return for suture removal  Encounter Diagnoses  Name Primary?  Marland Kitchen. Aftercare following surgery Yes  . Status post trigger finger release     There were no vitals taken for this visit.  11:44 AM Fuller CanadaStanley Tristina Sahagian, MD 10/28/2016

## 2016-11-05 ENCOUNTER — Ambulatory Visit (INDEPENDENT_AMBULATORY_CARE_PROVIDER_SITE_OTHER): Payer: Self-pay | Admitting: Orthopedic Surgery

## 2016-11-05 DIAGNOSIS — Z4889 Encounter for other specified surgical aftercare: Secondary | ICD-10-CM

## 2016-11-05 NOTE — Progress Notes (Signed)
Patient in today for suture removal right hand s/p right ring trigger finger release 10/24/16. Wound dry and intact. No signs or symptoms of infection. Wound cleaned with alcohol and sutures removed. Covered with band aid. Patient tolerated well. Given one month follow up appointment.

## 2016-12-09 ENCOUNTER — Encounter: Payer: Self-pay | Admitting: Orthopedic Surgery

## 2016-12-09 ENCOUNTER — Ambulatory Visit (INDEPENDENT_AMBULATORY_CARE_PROVIDER_SITE_OTHER): Payer: Self-pay | Admitting: Orthopedic Surgery

## 2016-12-09 DIAGNOSIS — Z9889 Other specified postprocedural states: Secondary | ICD-10-CM

## 2016-12-09 DIAGNOSIS — Z4889 Encounter for other specified surgical aftercare: Secondary | ICD-10-CM

## 2016-12-09 NOTE — Progress Notes (Signed)
Patient ID: Samantha Carey, female   DOB: [birth year], 71 y.o.   MRN: [id number]  POSTOP VISIT   Chief Complaint  Patient presents with  . Follow-up    1 month recheck on post op right ring trigger finger release, DOS 10-24-16.   Patient complains of tenderness over the incision of the right ring finger  She has no numbness or tingling in the digit. The color is normal she has no catching or locking she makes a full fist and opens the hand normally  Recommend desensitization with toothbrush technique  She would like a follow-up visit for new problem bilateral shoulder pain  Encounter Diagnoses  Name Primary?  Marland Kitchen Aftercare following surgery Yes  . Status post trigger finger release       10:23 AM Fuller Canada, MD 12/09/2016

## 2016-12-09 NOTE — Patient Instructions (Signed)
SOFT MEDIUM FIRM TOOTHBRUSHES  RUB THE INCISION 5 MIN 3  X  A DAY FOR 2 WEEKS  THEN USE THE MEDIUM FOR 2 WEEKS  AND THEN  USE THE FIRM FOR 2 WEEKS

## 2017-01-06 ENCOUNTER — Ambulatory Visit: Payer: Self-pay | Admitting: Orthopedic Surgery

## 2017-01-20 ENCOUNTER — Ambulatory Visit (INDEPENDENT_AMBULATORY_CARE_PROVIDER_SITE_OTHER): Payer: Medicare HMO

## 2017-01-20 ENCOUNTER — Ambulatory Visit (INDEPENDENT_AMBULATORY_CARE_PROVIDER_SITE_OTHER): Payer: Medicare HMO | Admitting: Orthopedic Surgery

## 2017-01-20 ENCOUNTER — Ambulatory Visit: Payer: Medicare HMO

## 2017-01-20 ENCOUNTER — Encounter: Payer: Self-pay | Admitting: Orthopedic Surgery

## 2017-01-20 VITALS — BP 102/71 | HR 85 | Ht 60.0 in | Wt 134.0 lb

## 2017-01-20 DIAGNOSIS — M7552 Bursitis of left shoulder: Secondary | ICD-10-CM

## 2017-01-20 DIAGNOSIS — M7551 Bursitis of right shoulder: Secondary | ICD-10-CM

## 2017-01-20 DIAGNOSIS — M25512 Pain in left shoulder: Secondary | ICD-10-CM

## 2017-01-20 DIAGNOSIS — M25511 Pain in right shoulder: Secondary | ICD-10-CM

## 2017-01-20 NOTE — Progress Notes (Signed)
NEW PATIENT OFFICE VISIT    Chief Complaint  Patient presents with  . Shoulder Pain    bilateral shoulder pain    71 year old female presents with bilateral shoulder pain chronic long-standing. She says it hurts in the bone on both shoulders anteriorly a dull aching pain worse when she rolls over to the right or left she does not associate any weakness or loss of motion    Review of Systems  Constitutional: Negative for chills, fever and weight loss.  Respiratory: Negative for shortness of breath.   Cardiovascular: Negative for chest pain.  Neurological: Negative for tingling.     Past Medical History:  Diagnosis Date  . Anxiety   . COPD (chronic obstructive pulmonary disease) (HCC)   . Hypercholesteremia   . Hypertension   . Migraine     Past Surgical History:  Procedure Laterality Date  . BACK SURGERY     x2  . TOTAL ABDOMINAL HYSTERECTOMY    . TRIGGER FINGER RELEASE Right 10/24/2016   Procedure: RIGHT RING FINGER TRIGGER RELEASE;  Surgeon: Vickki HearingStanley E Ghalia Reicks, MD;  Location: [facility];  Service: Orthopedics;  Laterality: Right;    Family History  Problem Relation Age of Onset  . Stroke Mother   . Liver disease Father        alocholic liver cirrhosis  . Liver cancer Father   . Stroke Maternal Grandmother   . Diabetes Son    Social History  Substance Use Topics  . Smoking status: Former Smoker    Years: 20.00    Types: Cigarettes    Quit date: 09/21/2016  . Smokeless tobacco: Never Used  . Alcohol use No    BP 102/71   Pulse 85   Ht 5' (1.524 m)   Wt 134 lb (60.8 kg)   BMI 26.17 kg/m   Physical Exam  Constitutional: She is oriented to person, place, and time. She appears well-developed and well-nourished.  Neurological: She is alert and oriented to person, place, and time.  Psychiatric: She has a normal mood and affect.  Vitals reviewed.   Ortho Exam Starting with the right shoulder have tenderness over the anterior joint line no swelling normal  temperature over the shoulder joint. She has normal passive range of motion as well as active range of motion without weakness shoulder is stable in abduction external rotation neurovascular exam is intact skin is normal  On the left shoulder we find a similar exam with no tenderness posteriorly but anterior tenderness full range of motion active and passive joint is stable in abduction external rotation cuff strength is normal neurovascular exam is intact skin shows no rashes or ulcerations  We do find bilateral impingement positive signs   No orders of the defined types were placed in this encounter.   Encounter Diagnoses  Name Primary?  . Right shoulder pain, unspecified chronicity Yes  . Left shoulder pain, unspecified chronicity   . Bursitis of both shoulders      PLAN:   Procedure note the subacromial injection shoulder left   Verbal consent was obtained to inject the  Left   Shoulder  Timeout was completed to confirm the injection site is a subacromial space of the  left  shoulder  Medication used Depo-Medrol 40 mg and lidocaine 1% 3 cc  Anesthesia was provided by ethyl chloride  The injection was performed in the left  posterior subacromial space. After pinning the skin with alcohol and anesthetized the skin with ethyl chloride the subacromial space was  injected using a 20-gauge needle. There were no complications  Sterile dressing was applied.   Procedure note the subacromial injection shoulder RIGHT  Verbal consent was obtained to inject the  RIGHT   Shoulder  Timeout was completed to confirm the injection site is a subacromial space of the  RIGHT  shoulder   Medication used Depo-Medrol 40 mg and lidocaine 1% 3 cc  Anesthesia was provided by ethyl chloride  The injection was performed in the RIGHT  posterior subacromial space. After pinning the skin with alcohol and anesthetized the skin with ethyl chloride the subacromial space was injected using a 20-gauge  needle. There were no complications  Sterile dressing was applied.  Recommend home exercises  Patient will follow-up for her right knee

## 2017-01-20 NOTE — Patient Instructions (Addendum)
Shoulder Range of Motion Exercises Shoulder range of motion (ROM) exercises are designed to keep the shoulder moving freely. They are often recommended for people who have shoulder pain. Phase 1 exercises When you are able, do this exercise 5-6 days per week, or as told by your health care provider. Work toward doing 2 sets of 10 swings. Pendulum Exercise  How To Do This Exercise Lying Down 1. Lie face-down on a bed with your abdomen close to the side of the bed. 2. Let your arm hang over the side of the bed. 3. Relax your shoulder, arm, and hand. 4. Slowly and gently swing your arm forward and back. Do not use your neck muscles to swing your arm. They should be relaxed. If you are struggling to swing your arm, have someone gently swing it for you. When you do this exercise for the first time, swing your arm at a 15 degree angle for 15 seconds, or swing your arm 10 times. As pain lessens over time, increase the angle of the swing to 30-45 degrees. 5. Repeat steps 1-4 with the other arm. How To Do This Exercise While Standing 1. Stand next to a sturdy chair or table and hold on to it with your hand. 1. Bend forward at the waist. 2. Bend your knees slightly. 3. Relax your other arm and let it hang limp. 4. Relax the shoulder blade of the arm that is hanging and let it drop. 5. While keeping your shoulder relaxed, use body motion to swing your arm in small circles. The first time you do this exercise, swing your arm for about 30 seconds or 10 times. When you do it next time, swing your arm for a little longer. 6. Stand up tall and relax. 7. Repeat steps 1-7, this time changing the direction of the circles. 2. Repeat steps 1-8 with the other arm. Phase 2 exercises Do these exercises 3-4 times per day on 5-6 days per week or as told by your health care provider. Work toward holding the stretch for 20 seconds. Stretching Exercise 1  1. Lift your arm straight out in front of you. 2. Bend your arm  90 degrees at the elbow (right angle) so your forearm goes across your body and looks like the letter "L." 3. Use your other arm to gently pull the elbow forward and across your body. 4. Repeat steps 1-3 with the other arm. Stretching Exercise 2  You will need a towel or rope for this exercise. 1. Bend one arm behind your back with the palm facing outward. 2. Hold a towel with your other hand. 3. Reach the arm that holds the towel above your head, and bend that arm at the elbow. Your wrist should be behind your neck. 4. Use your free hand to grab the free end of the towel. 5. With the higher hand, gently pull the towel up behind you. 6. With the lower hand, pull the towel down behind you. 7. Repeat steps 1-6 with the other arm. Phase 3 exercises Do each of these exercises at four different times of day (sessions) every day or as told by your health care provider. To begin with, repeat each exercise 5 times (repetitions). Work toward doing 3 sets of 12 repetitions or as told by your health care provider. Strengthening Exercise 1  You will need a light weight for this activity. As you grow stronger, you may use a heavier weight. 1. Standing with a weight in your hand, lift   your arm straight out to the side until it is at the same height as your shoulder. 2. Bend your arm at 90 degrees so that your fingers are pointing to the ceiling. 3. Slowly raise your hand until your arm is straight up in the air. 4. Repeat steps 1-3 with the other arm. Strengthening Exercise 2  You will need a light weight for this activity. As you grow stronger, you may use a heavier weight. 1. Standing with a weight in your hand, gradually move your straight arm in an arc, starting at your side, then out in front of you, then straight up over your head. 2. Gradually move your other arm in an arc, starting at your side, then out in front of you, then straight up over your head. 3. Repeat steps 1-2 with the other  arm. Strengthening Exercise 3  You will need an elastic band for this activity. As you grow stronger, gradually increase the size of the bands or increase the number of bands that you use at one time. 1. While standing, hold an elastic band in one hand and raise that arm up in the air. 2. With your other hand, pull down the band until that hand is by your side. 3. Repeat steps 1-2 with the other arm. This information is not intended to replace advice given to you by your health care provider. Make sure you discuss any questions you have with your health care provider. Document Released: 05/18/2003 Document Revised: 04/14/2016 Document Reviewed: 08/15/2014 Elsevier Interactive Patient Education  2017 [employer]  Bursitis Bursitis is when the fluid-filled sac (bursa) that covers and protects a joint is swollen (inflamed). Bursitis is most common near joints, especially the knees, elbows, hips, and shoulders. Follow these instructions at home:  Take medicines only as told by your doctor.  If you were prescribed an antibiotic medicine, finish it all even if you start to feel better.  Rest the affected area as told by your doctor.  Keep the area raised up.  Avoid doing things that make the pain worse.  Apply ice to the injured area:  Place ice in a plastic bag.  Place a towel between your skin and the bag.  Leave the ice on for 20 minutes, 2-3 times a day.  Use splints, braces, pads, or walking aids as told by your doctor.  Keep all follow-up visits as told by your doctor. This is important. Contact a doctor if:  You have more pain with home care.  You have a fever.  You have chills. This information is not intended to replace advice given to you by your health care provider. Make sure you discuss any questions you have with your health care provider. Document Released: 02/06/2010 Document Revised: 01/25/2016 Document Reviewed: 11/08/2013 Elsevier Interactive Patient  Education  2017 [employer]

## 2017-02-17 ENCOUNTER — Ambulatory Visit (INDEPENDENT_AMBULATORY_CARE_PROVIDER_SITE_OTHER): Payer: Medicare HMO | Admitting: Orthopedic Surgery

## 2017-02-17 ENCOUNTER — Encounter: Payer: Self-pay | Admitting: Orthopedic Surgery

## 2017-02-17 ENCOUNTER — Ambulatory Visit (INDEPENDENT_AMBULATORY_CARE_PROVIDER_SITE_OTHER): Payer: Medicare HMO

## 2017-02-17 VITALS — HR 78 | Ht 61.0 in | Wt 128.0 lb

## 2017-02-17 DIAGNOSIS — M25561 Pain in right knee: Secondary | ICD-10-CM

## 2017-02-17 DIAGNOSIS — M7051 Other bursitis of knee, right knee: Secondary | ICD-10-CM | POA: Diagnosis not present

## 2017-02-17 NOTE — Progress Notes (Signed)
NEW PROBLEM, EST PATIENT OFFICE VISIT    CHIEF COMPLAINT RIGHT KNEE PAIN    71 year old female with complaints of right knee pain.  She relates her onset of symptoms with post lumbar disc surgery sitting and getting up. She complains of medial pain atraumatic Onset when she got it she's had it now for several months. It's a dull aching pain on the medial side of the knee over the pin is answering tendons. It's constant and is worse with knee flexion. There's been no swelling or loss of motion    Review of Systems  Constitutional: Positive for fever.  Musculoskeletal: Positive for back pain.  Skin: Negative for rash.  Neurological: Negative for tingling.     Past Medical History:  Diagnosis Date  . Anxiety   . COPD (chronic obstructive pulmonary disease) (HCC)   . Hypercholesteremia   . Hypertension   . Migraine     Past Surgical History:  Procedure Laterality Date  . BACK SURGERY     x2  . TOTAL ABDOMINAL HYSTERECTOMY    . TRIGGER FINGER RELEASE Right 10/24/2016   Procedure: RIGHT RING FINGER TRIGGER RELEASE;  Surgeon: Vickki HearingStanley E Agnes Probert, MD;  Location: [facility];  Service: Orthopedics;  Laterality: Right;    Family History  Problem Relation Age of Onset  . Stroke Mother   . Liver disease Father        alocholic liver cirrhosis  . Liver cancer Father   . Stroke Maternal Grandmother   . Diabetes Son    Social History  Substance Use Topics  . Smoking status: Former Smoker    Years: 20.00    Types: Cigarettes    Quit date: 09/21/2016  . Smokeless tobacco: Never Used  . Alcohol use No    There were no vitals taken for this visit.  Physical Exam  Constitutional: She is oriented to person, place, and time. She appears well-developed and well-nourished.  Neurological: She is alert and oriented to person, place, and time. Gait normal.  Psychiatric: She has a normal mood and affect.  Vitals reviewed.   Right Knee Exam   Tenderness  The patient is experiencing  tenderness in the pes anserinus.  Range of Motion  Extension: normal  Flexion: normal   Muscle Strength   The patient has normal right knee strength.  Tests  McMurray:  Medial - negative Lateral - negative Drawer:       Anterior - negative    Posterior - negative Varus: negative Valgus: negative  Other  Erythema: absent Scars: absent Sensation: normal Pulse: present Swelling: none   Left Knee Exam   Tenderness  The patient is experiencing no tenderness.     Range of Motion  Extension: normal  Flexion: normal   Muscle Strength   The patient has normal left knee strength.  Tests  McMurray:  Medial - negative Lateral - negative Drawer:       Anterior - negative     Posterior - negative Varus: negative Valgus: negative  Other  Erythema: absent Scars: absent Sensation: normal Pulse: present Swelling: none      [facility] lateral and sunrise patellar x-ray: Medial compartment joint space narrowing consistent with moderate amount of osteoarthritis of the right knee   Encounter Diagnoses  Name Primary?  . Right knee pain, unspecified chronicity Yes  . Pes anserinus bursitis of right knee      PLAN:   I injected the pes tendon sheath  The patient gave consent and we did a  timeout to confirm the right knee as the site of injection  We cleaned the skin with alcohol, chloride and alcohol and injected the tendon with a 3 mL syringe 40 mg Depo-Medrol 2 mL 1% lidocaine  SHE WILL CONTINUE ICE 3 X A DAY   FU AS NEEDED

## 2017-02-17 NOTE — Patient Instructions (Addendum)
Bursitis Bursitis is when the fluid-filled sac (bursa) that covers and protects a joint is swollen (inflamed). Bursitis is most common near joints, especially the knees, elbows, hips, and shoulders. Follow these instructions at home:  Take medicines only as told by your doctor.  If you were prescribed an antibiotic medicine, finish it all even if you start to feel better.  Rest the affected area as told by your doctor. ? Keep the area raised up. ? Avoid doing things that make the pain worse.  Apply ice to the injured area: ? Place ice in a plastic bag. ? Place a towel between your skin and the bag. ? Leave the ice on for 20 minutes, 2-3 times a day.  Use splints, braces, pads, or walking aids as told by your doctor.  Keep all follow-up visits as told by your doctor. This is important. Contact a doctor if:  You have more pain with home care.  You have a fever.  You have chills. This information is not intended to replace advice given to you by your health care provider. Make sure you discuss any questions you have with your health care provider. Document Released: 02/06/2010 Document Revised: 01/25/2016 Document Reviewed: 11/08/2013 Elsevier Interactive Patient Education  2018 [employer] Inc[employer]  

## 2017-02-24 ENCOUNTER — Telehealth: Payer: Self-pay | Admitting: Orthopedic Surgery

## 2017-02-24 NOTE — Telephone Encounter (Signed)
Give Friday appt

## 2017-02-24 NOTE — Telephone Encounter (Signed)
Have called patient, two times; have been unable to leave messages (keeps ringing - no answer and no answer machine) to offer appointment for Friday..Marland Kitchen

## 2017-02-24 NOTE — Telephone Encounter (Signed)
Patient (and husband) called to relay that the right knee for which she was seen and had injection done 02/17/17 is continuing to hurt.  Patient requests another appointment.  I asked if has been doing the ice 3x per day as instructed - doing? Please advise.  Ph# [phone]

## 2017-02-28 NOTE — Telephone Encounter (Signed)
Still unable to reach patient after trying multiple times and contact #s.

## 2017-03-04 ENCOUNTER — Encounter: Payer: Self-pay | Admitting: Orthopedic Surgery

## 2017-03-04 ENCOUNTER — Ambulatory Visit (INDEPENDENT_AMBULATORY_CARE_PROVIDER_SITE_OTHER): Payer: Medicare HMO | Admitting: Orthopedic Surgery

## 2017-03-04 DIAGNOSIS — M7051 Other bursitis of knee, right knee: Secondary | ICD-10-CM

## 2017-03-04 NOTE — Patient Instructions (Signed)
MAX FREEZE 3 TIMES A DAY  

## 2017-03-04 NOTE — Progress Notes (Signed)
71 year old female status post injection for bursitis right knee. Patient did get some improvement but still having pain same area  Reinject has bursitis  Encounter Diagnosis  Name Primary?  . Pes anserinus bursitis of right knee Yes    Apply Max Fran LowesFreese 3 times a day  Procedure note right knee injection for bursitis  verbal consent was obtained to inject right knee PES BURSA  Timeout was completed to confirm the site of injection  The medications used were 40 mg of Depo-Medrol and 1% lidocaine 3 cc  Anesthesia was provided by ethyl chloride and the skin was prepped with alcohol.  After cleaning the skin with alcohol a 25-gauge needle was used to inject the right knee bursa.  There were no complications and a sterile bandage was applied

## 2017-04-22 ENCOUNTER — Other Ambulatory Visit: Payer: Self-pay | Admitting: Nurse Practitioner

## 2017-04-22 DIAGNOSIS — M5442 Lumbago with sciatica, left side: Principal | ICD-10-CM

## 2017-04-22 DIAGNOSIS — M5441 Lumbago with sciatica, right side: Secondary | ICD-10-CM

## 2017-05-08 ENCOUNTER — Inpatient Hospital Stay
Admission: RE | Admit: 2017-05-08 | Discharge: 2017-05-08 | Disposition: A | Discharge status: Home / Self Care | Payer: Medicare HMO | Source: Ambulatory Visit | Attending: Nurse Practitioner | Admitting: Nurse Practitioner

## 2018-03-22 ENCOUNTER — Encounter (HOSPITAL_COMMUNITY): Payer: Self-pay | Admitting: Emergency Medicine

## 2018-03-22 ENCOUNTER — Emergency Department (HOSPITAL_COMMUNITY)
Admission: EM | Admit: 2018-03-22 | Discharge: 2018-03-22 | Disposition: A | Discharge status: Home / Self Care | Payer: Medicare HMO | Attending: Emergency Medicine | Admitting: Emergency Medicine

## 2018-03-22 ENCOUNTER — Emergency Department (HOSPITAL_COMMUNITY): Payer: Medicare HMO

## 2018-03-22 ENCOUNTER — Other Ambulatory Visit: Payer: Self-pay

## 2018-03-22 DIAGNOSIS — Z87891 Personal history of nicotine dependence: Secondary | ICD-10-CM | POA: Diagnosis not present

## 2018-03-22 DIAGNOSIS — Y939 Activity, unspecified: Secondary | ICD-10-CM | POA: Insufficient documentation

## 2018-03-22 DIAGNOSIS — Y92007 Garden or yard of unspecified non-institutional (private) residence as the place of occurrence of the external cause: Secondary | ICD-10-CM | POA: Diagnosis not present

## 2018-03-22 DIAGNOSIS — S63105A Unspecified dislocation of left thumb, initial encounter: Secondary | ICD-10-CM | POA: Insufficient documentation

## 2018-03-22 DIAGNOSIS — I10 Essential (primary) hypertension: Secondary | ICD-10-CM | POA: Diagnosis not present

## 2018-03-22 DIAGNOSIS — S61012A Laceration without foreign body of left thumb without damage to nail, initial encounter: Secondary | ICD-10-CM

## 2018-03-22 DIAGNOSIS — Y999 Unspecified external cause status: Secondary | ICD-10-CM | POA: Diagnosis not present

## 2018-03-22 DIAGNOSIS — Z23 Encounter for immunization: Secondary | ICD-10-CM | POA: Diagnosis not present

## 2018-03-22 DIAGNOSIS — S61102A Unspecified open wound of left thumb with damage to nail, initial encounter: Secondary | ICD-10-CM | POA: Insufficient documentation

## 2018-03-22 DIAGNOSIS — W0110XA Fall on same level from slipping, tripping and stumbling with subsequent striking against unspecified object, initial encounter: Secondary | ICD-10-CM | POA: Insufficient documentation

## 2018-03-22 DIAGNOSIS — J449 Chronic obstructive pulmonary disease, unspecified: Secondary | ICD-10-CM | POA: Diagnosis not present

## 2018-03-22 DIAGNOSIS — S6992XA Unspecified injury of left wrist, hand and finger(s), initial encounter: Secondary | ICD-10-CM | POA: Diagnosis present

## 2018-03-22 MED ORDER — CEPHALEXIN 500 MG PO CAPS: 500.0000 mg | ORAL_CAPSULE | Freq: Four times a day (QID) | ORAL | 0 refills | Status: DC

## 2018-03-22 MED ORDER — TETANUS-DIPHTH-ACELL PERTUSSIS 5-2.5-18.5 LF-MCG/0.5 IM SUSP
0.5000 mL | Freq: Once | INTRAMUSCULAR | Status: AC
  Administered 2018-03-22: 0.5 mL via INTRAMUSCULAR
  Filled 2018-03-22: qty 0.5

## 2018-03-22 MED ORDER — LIDOCAINE HCL (PF) 1 % IJ SOLN
5.0000 mL | Freq: Once | INTRAMUSCULAR | Status: AC
  Administered 2018-03-22: 5 mL
  Filled 2018-03-22: qty 6

## 2018-03-22 MED ORDER — CEPHALEXIN 500 MG PO CAPS
500.0000 mg | ORAL_CAPSULE | Freq: Once | ORAL | Status: AC
  Administered 2018-03-22: 500 mg via ORAL
  Filled 2018-03-22: qty 1

## 2018-03-22 NOTE — ED Triage Notes (Signed)
Patient c/o laceration to left thumb after tripping and falling this morning outside of house. Denies hitting head, LOC, or dizziness. Patient unsure of last tetanus vaccination.

## 2018-03-22 NOTE — ED Provider Notes (Signed)
Emergency Department Provider Note   I have reviewed the triage vital signs and the nursing notes.   HISTORY  Chief Complaint Laceration   HPI Samantha Carey is a 72 y.o. female with medical history as documented below the presents the emergency department today with a laceration to her left volar surface of her thumb.  She sustained this approximately an hour prior to arrival.  It was involved in a fall.  No other injuries.  Hemostatic.  Unknown last tetanus.  Not take anything for symptoms prior to arrival here. No other associated or modifying symptoms.    Past Medical History:  Diagnosis Date  . Anxiety   . COPD (chronic obstructive pulmonary disease) (HCC)   . Hypercholesteremia   . Hypertension   . Migraine     Patient Active Problem List   Diagnosis Date Noted  . Status post trigger finger release 10/28/2016  . Trigger ring finger of right hand     Past Surgical History:  Procedure Laterality Date  . BACK SURGERY     x2  . TOTAL ABDOMINAL HYSTERECTOMY    . TRIGGER FINGER RELEASE Right 10/24/2016   Procedure: RIGHT RING FINGER TRIGGER RELEASE;  Surgeon: Vickki Hearing, MD;  Location: [facility];  Service: Orthopedics;  Laterality: Right;    Current Outpatient Rx  . Order #: [id number] Class: Historical Med  . Order #: [id number] Class: Historical Med  . Order #: [id number] Class: Historical Med  . Order #: [id number] Class: Historical Med  . Order #: [id number] Class: Historical Med  . Order #: [id number] Class: Historical Med  . Order #: [id number] Class: Print  . Order #: [id number] Class: Historical Med  . Order #: [id number] Class: Normal  . Order #: [id number] Class: Print  . Order #: [id number] Class: Historical Med  . Order #: [id number] Class: Historical Med  . Order #: [id number] Class: Historical Med  . Order #: [id number] Class: Historical Med  . Order #: [id number] Class: Historical Med    Allergies Morphine and related  Family History  Problem Relation Age of Onset  .  Stroke Mother   . Liver disease Father        alocholic liver cirrhosis  . Liver cancer Father   . Stroke Maternal Grandmother   . Diabetes Son     Social History Social History   Tobacco Use  . Smoking status: Former Smoker    Years: 20.00    Types: Cigarettes    Last attempt to quit: 09/21/2016    Years since quitting: 1.4  . Smokeless tobacco: Never Used  Substance Use Topics  . Alcohol use: No    Alcohol/week: 0.0 oz  . Drug use: No    Review of Systems  All other systems negative except as documented in the HPI. All pertinent positives and negatives as reviewed in the HPI. ____________________________________________   PHYSICAL EXAM:  VITAL SIGNS: ED Triage Vitals  Enc Vitals Group     BP 03/22/18 0934 (!) 115/95     Pulse Rate 03/22/18 0934 75     Resp 03/22/18 0934 18     Temp 03/22/18 0934 98.9 F (37.2 C)     Temp Source 03/22/18 0934 Oral     SpO2 03/22/18 0934 98 %     Weight 03/22/18 0931 135 lb (61.2 kg)     Height 03/22/18 0931 5' (1.524 m)    Constitutional: Alert and oriented. Well appearing and in no acute distress. Eyes: Conjunctivae are normal. PERRL. EOMI. Head: Atraumatic. Nose: No congestion/rhinnorhea. Mouth/Throat: Mucous membranes are moist.  Oropharynx non-erythematous. Neck: No stridor.  No meningeal signs.   Cardiovascular: Normal rate, regular rhythm. Good peripheral circulation. Grossly normal heart sounds.   Respiratory: Normal respiratory effort.  No retractions. Lungs CTAB. Gastrointestinal: Soft and nontender. No distention.  Musculoskeletal: No lower extremity tenderness nor edema. No gross deformities of extremities.  Decreased ability to flex left thumb at DIP Neurologic:  Normal speech and language. No gross focal neurologic deficits are appreciated.  Skin:  Skin is warm, dry to 3 cm laceration to volar surface of her left thumb.. No rash noted.   ____________________________________________   RADIOLOGY  Dg Finger  Thumb Left  Result Date: 03/22/2018 CLINICAL DATA:  Postreduction EXAM: LEFT THUMB 2+V COMPARISON:  03/22/2018 FINDINGS: Left thumb interphalangeal joint is aligned following reduction. Small avulsion fractures suspected of the left thumb distal phalanx along the volar surface. No other joint abnormality. Degenerative changes of the left first CMC joint. IMPRESSION: Improved alignment following reduction. Small avulsion fractures suspected of the left thumb distal phalanx volar surface. Electronically Signed   By: Judie PetitM.  Shick M.D.   On: 03/22/2018 11:49   Dg Finger Thumb Left  Result Date: 03/22/2018 CLINICAL DATA:  Injury, pain, deformity EXAM: LEFT THUMB 2+V COMPARISON:  None. FINDINGS: There is dorsal dislocation of the left thumb interphalangeal joint. No associated significant displaced fracture. Soft tissue swelling and subcutaneous air noted at the injury site. Bones are osteopenic. Degenerative changes of the left first CMC joint at the wrist with joint space loss, sclerosis and bony spurring. IMPRESSION: Left thumb interphalangeal joint dorsal dislocation. Osteopenia and degenerative changes. Electronically Signed   By: Judie PetitM.  Shick M.D.   On: 03/22/2018 10:17   ____________________________________________   PROCEDURES  Procedure(s) performed:   .Ortho Injury Treatment Date/Time: 03/22/2018 11:24 AM Performed by: Marily MemosMesner, Emma Schupp, MD Authorized by: Marily MemosMesner, Lillieanna Tuohy, MD   Consent:    Consent obtained:  Verbal   Consent given by:  Patient   Risks discussed:  Irreducible dislocation, fracture and nerve damage   Alternatives discussed:  No treatment, alternative treatment, delayed treatment and referralInjury location: finger Location details: left thumb Injury type: dislocation Dislocation type: DIP Pre-procedure distal perfusion: normal Pre-procedure neurological function: diminished Pre-procedure range of motion: reduced  Anesthesia: Local anesthesia used: no  Patient sedated:  NoManipulation performed: yes Reduction successful: yes X-ray confirmed reduction: yes Immobilization: splint Splint type: static finger Supplies used: aluminum splint and cotton padding Post-procedure distal perfusion: normal Post-procedure neurological function: diminished Post-procedure range of motion: improved Patient tolerance: Patient tolerated the procedure well with no immediate complications Comments: Some laxity at the DIP joint after reduction.  Has improved range of motion actively.  Unable to check sensation as she had a digital block.  Marland Kitchen..Laceration Repair Date/Time: 03/22/2018 11:25 AM Performed by: Marily MemosMesner, Connee Ikner, MD Authorized by: Marily MemosMesner, Peytyn Trine, MD   Consent:    Consent obtained:  Verbal   Consent given by:  Patient   Risks discussed:  Infection, need for additional repair, poor cosmetic result and pain   Alternatives discussed:  No treatment and delayed treatment Anesthesia (see MAR for exact dosages):    Anesthesia method:  Nerve block   Block location:  Proximal thumb dorsal and intrathecal   Block needle gauge:  25 G   Block anesthetic:  Lidocaine 1% w/o epi   Block technique:  Proximal thumb dorsal and intrathecal   Block injection procedure:  Anatomic landmarks identified and anatomic landmarks palpated   Block outcome:  Anesthesia achieved Laceration details:    Location:  Finger   Finger location:  L thumb   Length (cm):  3   Depth (mm):  5 Repair type:    Repair type:  Simple Pre-procedure details:    Preparation:  Patient was prepped and draped in usual sterile fashion Exploration:    Wound exploration: wound explored through full range of motion and entire depth of wound probed and visualized     Wound extent: areolar tissue violated and fascia violated     Contaminated: no   Treatment:    Area cleansed with:  Shur-Clens and saline   Amount of cleaning:  Standard   Irrigation solution:  Sterile saline   Irrigation volume:  50   Irrigation  method:  Syringe   Visualized foreign bodies/material removed: no   Skin repair:    Repair method:  Sutures   Suture size:  3-0   Suture material:  Prolene   Suture technique:  Simple interrupted   Number of sutures:  5 Approximation:    Approximation:  Close Post-procedure details:    Dressing:  Bulky dressing, non-adherent dressing, splint for protection and tube gauze   Patient tolerance of procedure:  Tolerated well, no immediate complications   ____________________________________________   INITIAL IMPRESSION / ASSESSMENT AND PLAN / ED COURSE  tdap updated.  Wound repaired as above, dislocation repaired as above, there is still quite a bit laxity in the joint compared to the right so there is some concern for flexor tendon injury and the possibility that the bone actually caused the laceration.  These findings I discussed with Dr. Romeo Apple on orthopedics and he agrees with antibiotics and follow the patient up in the office.   Pertinent labs & imaging results that were available during my care of the patient were reviewed by me and considered in my medical decision making (see chart for details).  ____________________________________________  FINAL CLINICAL IMPRESSION(S) / ED DIAGNOSES  Final diagnoses:  Laceration of left thumb without foreign body without damage to nail, initial encounter  Dislocation of left thumb, initial encounter     MEDICATIONS GIVEN DURING THIS VISIT:  Medications  Tdap (BOOSTRIX) injection 0.5 mL (0.5 mLs Intramuscular Given 03/22/18 0954)  lidocaine (PF) (XYLOCAINE) 1 % injection 5 mL (5 mLs Other Given 03/22/18 1112)  cephALEXin (KEFLEX) capsule 500 mg (500 mg Oral Given 03/22/18 1110)     NEW OUTPATIENT MEDICATIONS STARTED DURING THIS VISIT:  Discharge Medication List as of 03/22/2018 11:39 AM    START taking these medications   Details  cephALEXin (KEFLEX) 500 MG capsule Take 1 capsule (500 mg total) by mouth 4 (four) times daily.,  Starting Sun 03/22/2018, Print        Note:  This note was prepared with assistance of Dragon voice recognition software. Occasional wrong-word or sound-a-like substitutions may have occurred due to the inherent limitations of voice recognition software.   Marily Memos, MD 03/22/18 1538

## 2018-03-23 ENCOUNTER — Telehealth: Payer: Self-pay | Admitting: Orthopedic Surgery

## 2018-03-23 NOTE — Telephone Encounter (Signed)
I called and spoke with the patient.  Ms. Samantha Carey is scheduled to see Dr. Hilda LiasKeeling this coming Wednesday, 03/25/18 at 9:20

## 2018-03-23 NOTE — Telephone Encounter (Signed)
Patient and her husband called for appointment following ER visit on 03/22/18.  She has a laceration/dislocation of the left thumb.  The husband states Mrs. Samantha Carey does not drive and that he can only bring her either Wednesday or Thursday of this week.  May I open an appointment for you at 8:50 on Wednesday or would you see her on Thursday afternoon?  Please advise  Thanks so much

## 2018-03-23 NOTE — Telephone Encounter (Signed)
Can Dr. Hilda LiasKeeling see her

## 2018-03-25 ENCOUNTER — Ambulatory Visit: Payer: Medicare HMO | Admitting: Orthopaedic Surgery

## 2018-03-25 ENCOUNTER — Encounter: Payer: Self-pay | Admitting: Orthopaedic Surgery

## 2018-03-25 VITALS — BP 142/79 | HR 72 | Ht 60.0 in | Wt 166.0 lb

## 2018-03-25 DIAGNOSIS — S62502B Fracture of unspecified phalanx of left thumb, initial encounter for open fracture: Secondary | ICD-10-CM | POA: Diagnosis not present

## 2018-03-25 NOTE — Progress Notes (Signed)
Patient GN:Samantha Carey, female DOB:08/29/1946, 72 y.o. HYQ:[id number]  Chief Complaint  Patient presents with  . Hand Pain    left thumb    HPI  Samantha Carey is a 72 y.o. female she fell and hurt her left thumb on 03-22-18.  She was seen in the ER.  She had a dislocation of the IP joint of the thumb which was reduced in the ER. According to the ER records she had a deep laceration on the volar side with disruption of the fascia.  Thus, she had an open fracture dislocation of the thumb.  Post reduction x-rays showed: IMPRESSION: Improved alignment following reduction. Small avulsion fractures suspected of the left thumb distal phalanx volar surface.  She was given antibiotics (Keflex)  after the wound was closed with sutures.  She was placed in a thumb splint and dressing.   She is on chronic pain medicine for her back.    Body mass index is 32.42 kg/m.  ROS  Review of Systems  Constitutional: Positive for activity change.  Respiratory: Positive for shortness of breath. Negative for cough.   Musculoskeletal: Positive for arthralgias and back pain.  Neurological: Positive for headaches.  Psychiatric/Behavioral: The patient is nervous/anxious.   All other systems reviewed and are negative.   All other systems reviewed and are negative.  Past Medical History:  Diagnosis Date  . Anxiety   . COPD (chronic obstructive pulmonary disease) (HCC)   . Hypercholesteremia   . Hypertension   . Migraine     Past Surgical History:  Procedure Laterality Date  . BACK SURGERY     x2  . TOTAL ABDOMINAL HYSTERECTOMY    . TRIGGER FINGER RELEASE Right 10/24/2016   Procedure: RIGHT RING FINGER TRIGGER RELEASE;  Surgeon: Vickki Hearing, MD;  Location: [facility];  Service: Orthopedics;  Laterality: Right;    Family History  Problem Relation Age of Onset  . Stroke Mother   . Diabetes Mother   . Liver disease Father        alocholic liver cirrhosis  . Liver cancer Father   . Cancer Father    . Stroke Maternal Grandmother   . Diabetes Son     Social History Social History   Tobacco Use  . Smoking status: Former Smoker    Years: 20.00    Types: Cigarettes    Last attempt to quit: 09/21/2016    Years since quitting: 1.5  . Smokeless tobacco: Never Used  Substance Use Topics  . Alcohol use: No    Alcohol/week: 0.0 oz  . Drug use: No    Allergies  Allergen Reactions  . Morphine And Related Nausea And Vomiting    Current Outpatient Medications  Medication Sig Dispense Refill  . albuterol (PROVENTIL) (2.5 MG/3ML) 0.083% nebulizer solution Inhale 3 mLs into the lungs every 6 (six) hours as needed for shortness of breath.    . ALPRAZolam (XANAX) 1 MG tablet Take 1 mg by mouth 4 (four) times daily.   5  . atorvastatin (LIPITOR) 80 MG tablet Take 80 mg by mouth daily.     Marland Kitchen buPROPion (WELLBUTRIN XL) 300 MG 24 hr tablet Take 300 mg by mouth daily.     . cephALEXin (KEFLEX) 500 MG capsule Take 1 capsule (500 mg total) by mouth 4 (four) times daily. 40 capsule 0  . diphenhydrAMINE (BENADRYL) 25 MG tablet Take 25 mg by mouth every 6 (six) hours as needed for allergies.    Marland Kitchen gabapentin (NEURONTIN) 800 MG tablet  Take 1 tablet by mouth 3 (three) times daily.    Marland Kitchen. ibuprofen (ADVIL,MOTRIN) 800 MG tablet Take 1 tablet (800 mg total) by mouth every 8 (eight) hours as needed. 90 tablet 1  . ibuprofen (ADVIL,MOTRIN) 800 MG tablet Take 1 tablet (800 mg total) by mouth every 8 (eight) hours as needed. 90 tablet 1  . metoprolol (TOPROL-XL) 200 MG 24 hr tablet Take 100 mg by mouth daily.     . nicotine (NICODERM CQ - DOSED IN MG/24 HOURS) 21 mg/24hr patch Place 21 mg onto the skin daily.    . nitrofurantoin, macrocrystal-monohydrate, (MACROBID) 100 MG capsule Take 100 mg by mouth daily.    . Oxycodone HCl 20 MG TABS Take 20 mg by mouth 4 (four) times daily as needed for pain.    . SYMBICORT 160-4.5 MCG/ACT inhaler Inhale 2 puffs into the lungs 2 (two) times daily.    Marland Kitchen.  triamterene-hydrochlorothiazide (MAXZIDE) 75-50 MG tablet Take 1 tablet by mouth daily.     No current facility-administered medications for this visit.      Physical Exam  Blood pressure (!) 142/79, pulse 72, height 5' (1.524 m), weight 166 lb (75.3 kg).  Constitutional: overall normal hygiene, normal nutrition, well developed, normal grooming, normal body habitus. Assistive device:splint to left thumb  Musculoskeletal: gait and station Limp none, muscle tone and strength are normal, no tremors or atrophy is present.  .  Neurological: coordination overall normal.  Deep tendon reflex/nerve stretch intact.  Sensation normal.  Cranial nerves II-XII intact.   Skin:   Normal overall no scars, lesions, ulcers or rashes. No psoriasis.  Psychiatric: Alert and oriented x 3.  Recent memory intact, remote memory unclear.  Normal mood and affect. Well groomed.  Good eye contact.  Cardiovascular: overall no swelling, no varicosities, no edema bilaterally, normal temperatures of the legs and arms, no clubbing, cyanosis and good capillary refill.  Lymphatic: palpation is normal.  Left thumb has sutures on the volar side at the IP joint level.  Wound is not red or draining.  NV intact. ROM is decreased.  All other systems reviewed and are negative   The patient has been educated about the nature of the problem(s) and counseled on treatment options.  The patient appeared to understand what I have discussed and is in agreement with it.  Encounter Diagnoses  Name Primary?  . Right shoulder pain, unspecified chronicity Yes  . Fracture dislocation of left thumb, open, initial encounter     PLAN Call if any problems.  Precautions discussed.  Continue current medications.   A thumb spica cast was applied.  Return to clinic 1 week   X-rays of the left thumb out of the cast.  Wound care, possible suture removal on next visit.  Electronically Signed Darreld McleanWayne Nicholad Kautzman, MD 7/24/201910:38 AM

## 2018-03-31 ENCOUNTER — Ambulatory Visit: Payer: Medicare HMO | Admitting: Orthopaedic Surgery

## 2018-03-31 ENCOUNTER — Ambulatory Visit (INDEPENDENT_AMBULATORY_CARE_PROVIDER_SITE_OTHER): Payer: Medicare HMO

## 2018-03-31 ENCOUNTER — Encounter: Payer: Self-pay | Admitting: Orthopaedic Surgery

## 2018-03-31 VITALS — BP 129/83 | HR 75 | Ht 60.0 in | Wt 159.0 lb

## 2018-03-31 DIAGNOSIS — S62502B Fracture of unspecified phalanx of left thumb, initial encounter for open fracture: Secondary | ICD-10-CM | POA: Diagnosis not present

## 2018-03-31 DIAGNOSIS — S62502D Fracture of unspecified phalanx of left thumb, subsequent encounter for fracture with routine healing: Secondary | ICD-10-CM

## 2018-03-31 NOTE — Progress Notes (Signed)
Patient Samantha Carey, female DOB:[birth year], 72 y.o. EAV:[id number]  Chief Complaint  Patient presents with  . Follow-up    Left thum fx 03/22/18    HPI  Samantha Carey is a 72 y.o. female who had an open fracture dislocation of the left thumb at the IP joint.  She has done well.  She is here for x-rays and removal of sutures today.  There has been no redness or discharge of the thumb.  NV has been intact.   Body mass index is 31.05 kg/m.  ROS  Review of Systems  Constitutional: Positive for activity change.  Respiratory: Positive for shortness of breath. Negative for cough.   Musculoskeletal: Positive for arthralgias and back pain.  Neurological: Positive for headaches.  Psychiatric/Behavioral: The patient is nervous/anxious.   All other systems reviewed and are negative.   All other systems reviewed and are negative.  Past Medical History:  Diagnosis Date  . Anxiety   . COPD (chronic obstructive pulmonary disease) (HCC)   . Hypercholesteremia   . Hypertension   . Migraine     Past Surgical History:  Procedure Laterality Date  . BACK SURGERY     x2  . TOTAL ABDOMINAL HYSTERECTOMY    . TRIGGER FINGER RELEASE Right 10/24/2016   Procedure: RIGHT RING FINGER TRIGGER RELEASE;  Surgeon: Vickki Hearing, MD;  Location: [facility];  Service: Orthopedics;  Laterality: Right;    Family History  Problem Relation Age of Onset  . Stroke Mother   . Diabetes Mother   . Liver disease Father        alocholic liver cirrhosis  . Liver cancer Father   . Cancer Father   . Stroke Maternal Grandmother   . Diabetes Son     Social History Social History   Tobacco Use  . Smoking status: Former Smoker    Years: 20.00    Types: Cigarettes    Last attempt to quit: 09/21/2016    Years since quitting: 1.5  . Smokeless tobacco: Never Used  Substance Use Topics  . Alcohol use: No    Alcohol/week: 0.0 oz  . Drug use: No    Allergies  Allergen Reactions  . Morphine And Related  Nausea And Vomiting    Current Outpatient Medications  Medication Sig Dispense Refill  . albuterol (PROVENTIL) (2.5 MG/3ML) 0.083% nebulizer solution Inhale 3 mLs into the lungs every 6 (six) hours as needed for shortness of breath.    . ALPRAZolam (XANAX) 1 MG tablet Take 1 mg by mouth 4 (four) times daily.   5  . atorvastatin (LIPITOR) 80 MG tablet Take 80 mg by mouth daily.     Marland Kitchen buPROPion (WELLBUTRIN XL) 300 MG 24 hr tablet Take 300 mg by mouth daily.     . cephALEXin (KEFLEX) 500 MG capsule Take 1 capsule (500 mg total) by mouth 4 (four) times daily. 40 capsule 0  . diphenhydrAMINE (BENADRYL) 25 MG tablet Take 25 mg by mouth every 6 (six) hours as needed for allergies.    Marland Kitchen gabapentin (NEURONTIN) 800 MG tablet Take 1 tablet by mouth 3 (three) times daily.    Marland Kitchen ibuprofen (ADVIL,MOTRIN) 800 MG tablet Take 1 tablet (800 mg total) by mouth every 8 (eight) hours as needed. 90 tablet 1  . ibuprofen (ADVIL,MOTRIN) 800 MG tablet Take 1 tablet (800 mg total) by mouth every 8 (eight) hours as needed. 90 tablet 1  . metoprolol (TOPROL-XL) 200 MG 24 hr tablet Take 100 mg by mouth daily.     Marland Kitchen  nicotine (NICODERM CQ - DOSED IN MG/24 HOURS) 21 mg/24hr patch Place 21 mg onto the skin daily.    . nitrofurantoin, macrocrystal-monohydrate, (MACROBID) 100 MG capsule Take 100 mg by mouth daily.    . Oxycodone HCl 20 MG TABS Take 20 mg by mouth 4 (four) times daily as needed for pain.    . SYMBICORT 160-4.5 MCG/ACT inhaler Inhale 2 puffs into the lungs 2 (two) times daily.    Marland Kitchen. triamterene-hydrochlorothiazide (MAXZIDE) 75-50 MG tablet Take 1 tablet by mouth daily.     No current facility-administered medications for this visit.      Physical Exam  Blood pressure 129/83, pulse 75, height 5' (1.524 m), weight 159 lb (72.1 kg).  Constitutional: overall normal hygiene, normal nutrition, well developed, normal grooming, normal body habitus. Assistive device:left thumb splint  Musculoskeletal: gait and  station Limp none, muscle tone and strength are normal, no tremors or atrophy is present.  .  Neurological: coordination overall normal.  Deep tendon reflex/nerve stretch intact.  Sensation normal.  Cranial nerves II-XII intact.   Skin:   Normal overall no scars, lesions, ulcers or rashes. No psoriasis.  Psychiatric: Alert and oriented x 3.  Recent memory intact, remote memory unclear.  Normal mood and affect. Well groomed.  Good eye contact.  Cardiovascular: overall no swelling, no varicosities, no edema bilaterally, normal temperatures of the legs and arms, no clubbing, cyanosis and good capillary refill.  Lymphatic: palpation is normal.  Left thumb wound at the IP joint volar side looks good.  NV intact. ROM is tender.    All other systems reviewed and are negative   The patient has been educated about the nature of the problem(s) and counseled on treatment options.  The patient appeared to understand what I have discussed and is in agreement with it.  Encounter Diagnosis  Name Primary?  . Open fracture dislocation of left thumb with routine healing, subsequent encounter Yes    PLAN Call if any problems.  Precautions discussed.  Continue current medications.   Sutures removed.  Return to clinic 2 weeks   X-rays on return.  A new Velcro splint is given. Instructions on use given.  Electronically Signed Darreld McleanWayne Laqueisha Catalina, MD 7/30/201910:57 AM

## 2018-04-14 ENCOUNTER — Ambulatory Visit: Payer: Self-pay | Admitting: Orthopaedic Surgery

## 2018-04-23 ENCOUNTER — Ambulatory Visit (INDEPENDENT_AMBULATORY_CARE_PROVIDER_SITE_OTHER): Payer: Medicare HMO

## 2018-04-23 ENCOUNTER — Ambulatory Visit: Payer: Medicare HMO | Admitting: Orthopaedic Surgery

## 2018-04-23 ENCOUNTER — Encounter: Payer: Self-pay | Admitting: Orthopaedic Surgery

## 2018-04-23 DIAGNOSIS — S62502D Fracture of unspecified phalanx of left thumb, subsequent encounter for fracture with routine healing: Secondary | ICD-10-CM | POA: Diagnosis not present

## 2018-04-23 NOTE — Progress Notes (Signed)
CC:  My thumb is better  Her wound of the left thumb is healed.  She has good ROM of motion.  NV intact.  X-rays were done of the left thumb, reported separately.  Encounter Diagnosis  Name Primary?  . Open fracture dislocation of left thumb with routine healing, subsequent encounter Yes   Return in one month for final check and X-rays then.  Call if any problem.  Precautions discussed.   Electronically Signed Darreld McleanWayne Darcell Sabino, MD 8/22/201911:10 AM

## 2018-05-21 ENCOUNTER — Ambulatory Visit (INDEPENDENT_AMBULATORY_CARE_PROVIDER_SITE_OTHER): Payer: Medicare HMO

## 2018-05-21 ENCOUNTER — Encounter: Payer: Self-pay | Admitting: Orthopaedic Surgery

## 2018-05-21 ENCOUNTER — Ambulatory Visit: Payer: Medicare HMO | Admitting: Orthopaedic Surgery

## 2018-05-21 DIAGNOSIS — S62502D Fracture of unspecified phalanx of left thumb, subsequent encounter for fracture with routine healing: Secondary | ICD-10-CM | POA: Diagnosis not present

## 2018-05-21 NOTE — Progress Notes (Signed)
CC:  My thumb is better  She has no pain to the left thumb.  ROM is good.  NV intact.  X-rays were done and reported separately of the left thumb.  Encounter Diagnosis  Name Primary?  . Open fracture dislocation of left thumb with routine healing, subsequent encounter Yes   I will see her as needed.  Call if any problem.  Precautions discussed.   Electronically Signed Darreld McleanWayne Leeon Makar, MD 9/19/20199:48 AM

## 2018-06-04 ENCOUNTER — Ambulatory Visit: Payer: Self-pay | Admitting: Orthopaedic Surgery

## 2018-06-17 ENCOUNTER — Encounter: Payer: Self-pay | Admitting: Orthopaedic Surgery

## 2018-06-17 ENCOUNTER — Ambulatory Visit: Payer: Medicare HMO | Admitting: Orthopaedic Surgery

## 2018-06-17 VITALS — BP 129/77 | HR 73 | Ht 60.0 in | Wt 159.0 lb

## 2018-06-17 DIAGNOSIS — G8929 Other chronic pain: Secondary | ICD-10-CM

## 2018-06-17 DIAGNOSIS — M25561 Pain in right knee: Secondary | ICD-10-CM | POA: Diagnosis not present

## 2018-06-17 NOTE — Progress Notes (Addendum)
Samantha Carey, female DOB:March 20, 1946, 72 y.o. EAV:[id number]  Chief Complaint  Samantha presents with  . Knee Pain    right knee    HPI  Samantha Carey is a 72 y.o. female who has right knee pain chronically.  I had seen her over the summer with a left thumb injury.  She is doing well from that.  She had seen Dr. Madelon Lips in [place] last year and earlier this year for the knee pain on the right.  She had a MRI at [facility] which showed a tear of the medial meniscus.  She also has significant degenerative changes of the right knee.  Dr. Madelon Lips had talked with her in May and told her about a possible total knee instead of doing an arthroscopy of the knee.  She and her husband have discussed this.  They have talked to family members.  They would like to consider the arthroscopy first.  They would like to have it done at [facility].  I have reviewed Dr. Candise Bowens notes.  I will have her see Dr. Romeo Apple.   Body mass index is 31.05 kg/m.  ROS  Review of Systems  Constitutional: Positive for activity change.  Respiratory: Positive for shortness of breath. Negative for cough.   Musculoskeletal: Positive for arthralgias and back pain.  Neurological: Positive for headaches.  Psychiatric/Behavioral: The Samantha is nervous/anxious.   All other systems reviewed and are negative.   All other systems reviewed and are negative.  The following is a summary of the past history medically, past history surgically, known current medicines, social history and family history.  This information is gathered electronically by the computer from prior information and documentation.  I review this each visit and have found including this information at this point in the chart is beneficial and informative.    Past Medical History:  Diagnosis Date  . Anxiety   . COPD (chronic obstructive pulmonary disease) (HCC)   . Hypercholesteremia   . Hypertension   . Migraine     Past Surgical History:   Procedure Laterality Date  . BACK SURGERY     x2  . TOTAL ABDOMINAL HYSTERECTOMY    . TRIGGER FINGER RELEASE Right 10/24/2016   Procedure: RIGHT RING FINGER TRIGGER RELEASE;  Surgeon: Vickki Hearing, MD;  Location: [facility];  Service: Orthopedics;  Laterality: Right;    Family History  Problem Relation Age of Onset  . Stroke Mother   . Diabetes Mother   . Liver disease Father        alocholic liver cirrhosis  . Liver cancer Father   . Cancer Father   . Stroke Maternal Grandmother   . Diabetes Son     Social History Social History   Tobacco Use  . Smoking status: Former Smoker    Years: 20.00    Types: Cigarettes    Last attempt to quit: 09/21/2016    Years since quitting: 1.7  . Smokeless tobacco: Never Used  Substance Use Topics  . Alcohol use: No    Alcohol/week: 0.0 standard drinks  . Drug use: No    Allergies  Allergen Reactions  . Morphine And Related Nausea And Vomiting    Current Outpatient Medications  Medication Sig Dispense Refill  . albuterol (PROVENTIL) (2.5 MG/3ML) 0.083% nebulizer solution Inhale 3 mLs into the lungs every 6 (six) hours as needed for shortness of breath.    . ALPRAZolam (XANAX) 1 MG tablet Take 1 mg by mouth 4 (four) times daily.  5  . atorvastatin (LIPITOR) 80 MG tablet Take 80 mg by mouth daily.     Marland Kitchen buPROPion (WELLBUTRIN XL) 300 MG 24 hr tablet Take 300 mg by mouth daily.     . cephALEXin (KEFLEX) 500 MG capsule Take 1 capsule (500 mg total) by mouth 4 (four) times daily. 40 capsule 0  . diphenhydrAMINE (BENADRYL) 25 MG tablet Take 25 mg by mouth every 6 (six) hours as needed for allergies.    Marland Kitchen gabapentin (NEURONTIN) 800 MG tablet Take 1 tablet by mouth 3 (three) times daily.    Marland Kitchen ibuprofen (ADVIL,MOTRIN) 800 MG tablet Take 1 tablet (800 mg total) by mouth every 8 (eight) hours as needed. 90 tablet 1  . ibuprofen (ADVIL,MOTRIN) 800 MG tablet Take 1 tablet (800 mg total) by mouth every 8 (eight) hours as needed. 90 tablet 1  .  metoprolol (TOPROL-XL) 200 MG 24 hr tablet Take 100 mg by mouth daily.     . nicotine (NICODERM CQ - DOSED IN MG/24 HOURS) 21 mg/24hr patch Place 21 mg onto the skin daily.    . nitrofurantoin, macrocrystal-monohydrate, (MACROBID) 100 MG capsule Take 100 mg by mouth daily.    . Oxycodone HCl 20 MG TABS Take 20 mg by mouth 4 (four) times daily as needed for pain.    . SYMBICORT 160-4.5 MCG/ACT inhaler Inhale 2 puffs into the lungs 2 (two) times daily.    Marland Kitchen triamterene-hydrochlorothiazide (MAXZIDE) 75-50 MG tablet Take 1 tablet by mouth daily.     No current facility-administered medications for this visit.      Physical Exam  Blood pressure 129/77, pulse 73, height 5' (1.524 m), weight 159 lb (72.1 kg).  Constitutional: overall normal hygiene, normal nutrition, well developed, normal grooming, normal body habitus. Assistive device:none  Musculoskeletal: gait and station Limp right, muscle tone and strength are normal, no tremors or atrophy is present.  .  Neurological: coordination overall normal.  Deep tendon reflex/nerve stretch intact.  Sensation normal.  Cranial nerves II-XII intact.   Skin:   Normal overall no scars, lesions, ulcers or rashes. No psoriasis.  Psychiatric: Alert and oriented x 3.  Recent memory intact, remote memory unclear.  Normal mood and affect. Well groomed.  Good eye contact.  Cardiovascular: overall no swelling, no varicosities, no edema bilaterally, normal temperatures of the legs and arms, no clubbing, cyanosis and good capillary refill.  Lymphatic: palpation is normal.  Right knee has crepitus and tenderness more medially.  ROM is 0 to 105.  She has some effusion.  She has limp to the right.  She has positive medial [place].  NV intact.  All other systems reviewed and are negative   The Samantha has been educated about the nature of the problem(s) and counseled on treatment options.  The Samantha appeared to understand what I have discussed and is in  agreement with it.  Encounter Diagnosis  Name Primary?  . Chronic pain of right knee Yes   PROCEDURE NOTE:  The Samantha requests injections of the right knee , verbal consent was obtained.  The right knee was prepped appropriately after time out was performed.   Sterile technique was observed and injection of 1 cc of Depo-Medrol 40 mg with several cc's of plain xylocaine. Anesthesia was provided by ethyl chloride and a 20-gauge needle was used to inject the knee area. The injection was tolerated well.  A band aid dressing was applied.  The Samantha was advised to apply ice later today and tomorrow to  the injection sight as needed.   PLAN Call if any problems.  Precautions discussed.  Continue current medications.   Return to clinic to see Dr. Romeo Apple    Electronically Signed Darreld Mclean, MD 10/16/201910:07 AM

## 2018-06-25 ENCOUNTER — Encounter: Payer: Self-pay | Admitting: Orthopedic Surgery

## 2018-06-25 ENCOUNTER — Ambulatory Visit (INDEPENDENT_AMBULATORY_CARE_PROVIDER_SITE_OTHER): Payer: Medicare HMO | Admitting: Orthopedic Surgery

## 2018-06-25 VITALS — BP 132/80 | HR 80 | Ht 64.0 in | Wt 164.0 lb

## 2018-06-25 DIAGNOSIS — M23321 Other meniscus derangements, posterior horn of medial meniscus, right knee: Secondary | ICD-10-CM | POA: Diagnosis not present

## 2018-06-25 DIAGNOSIS — M7051 Other bursitis of knee, right knee: Secondary | ICD-10-CM | POA: Diagnosis not present

## 2018-06-25 NOTE — Patient Instructions (Signed)
Meniscus Injury, Arthroscopy Arthroscopy is a surgical procedure that involves the use of a small scope that has a camera and surgical instruments on the end (arthroscope). An arthroscope can be used to repair your meniscus injury.  LET YOUR HEALTH CARE PROVIDER KNOW ABOUT:  Any allergies you have.  All medicines you are taking, including vitamins, herbs, eyedrops, creams, and over-the-counter medicines.  Any recent colds or infections you have had or currently have.  Previous problems you or members of your family have had with the use of anesthetics.  Any blood disorders or blood clotting problems you have.  Previous surgeries you have had.  Medical conditions you have. RISKS AND COMPLICATIONS Generally, this is a safe procedure. However, as with any procedure, problems can occur. Possible problems include:  Damage to nerves or blood vessels.  Excess bleeding.  Blood clots.  Infection. BEFORE THE PROCEDURE  Do not eat or drink for 6-8 hours before the procedure.  Take medicines as directed by your surgeon. Ask your surgeon about changing or stopping your regular medicines.  You may have lab tests the morning of surgery. PROCEDURE  You will be given one of the following:   A medicine that numbs the area (local anesthesia).  A medicine that makes you go to sleep (general anesthesia).  A medicine injected into your spine that numbs your body below the waist (spinal anesthesia). Most often, several small cuts (incisions) are made in the knee. The arthroscope and instruments go into the incisions to repair the damage. The torn portion of the meniscus is removed.   AFTER THE PROCEDURE  You will be taken to the recovery area where your progress will be monitored. When you are awake, stable, and taking fluids without complications, you will be allowed to go home. This is usually the same day. A torn or stretched ligament (ligament sprain) may take 6-8 weeks to heal.   It  takes about the 4-6 WEEKS if your surgeon removed a torn meniscus.  A repaired meniscus may require 6-12 weeks of recovery time.  A torn ligament needing reconstructive surgery may take 6-12 months to heal fully.   This information is not intended to replace advice given to you by your health care provider. Make sure you discuss any questions you have with your health care provider. You have decided to proceed with operative arthroscopy of the knee. You have decided not to continue with nonoperative measures such as but not limited to oral medication, weight loss, activity modification, physical therapy, bracing, or injection.  We will perform operative arthroscopy of the knee. Some of the risks associated with arthroscopic surgery of the knee include but are not limited to Bleeding Infection Swelling Stiffness Blood clot Pain  If you're not comfortable with these risks and would like to continue with nonoperative treatment please let Dr. Treyvin Glidden know prior to your surgery.   Document Released: 08/16/2000 Document Revised: 08/24/2013 Document Reviewed: 01/15/2013 Elsevier Interactive Patient Education 2016 [employer] You have decided to proceed with operative arthroscopy of the knee. You have decided not to continue with nonoperative measures such as but not limited to oral medication, weight loss, activity modification, physical therapy, bracing, or injection.  In compliance with recent [place] law in federal regulation regarding opioid use and abuse and addiction, we will taper (stop) opioid medication after 2 weeks.  We will perform operative arthroscopy of the knee. Some of the risks associated with arthroscopic surgery of the knee include but are not limited to   Bleeding Infection Swelling Stiffness Blood clot Pain  If you're not comfortable with these risks and would like to continue with nonoperative treatment please let Dr. Farid Grigorian know prior to your surgery. 

## 2018-06-25 NOTE — Progress Notes (Signed)
Patient Samantha Carey, female DOB:1945-09-21, 72 y.o. EAV:[id number]  Chief Complaint  Patient presents with  . Knee Pain    right knee surgical consult Dr Hilda Lias     HPI  Samantha Carey is a 72 y.o. female who was referred to me by Dr. Hilda Lias after seeing Dr. Madelon Lips and discussing the possibility of meniscectomy versus knee replacement  The patient is status post 2 lumbar surgeries has chronic pain in her knee back and in other joints  She complains of pain over the medial aspect of her right knee joint line Pez bursa and inner side of her right leg with radiation down the leg described as a dull ache.  She describes severe pain constant worse with activity that she is had for over a year not getting any better and not responding to nonoperative treatment   Body mass index is 28.15 kg/m.  ROS Update from June 17, 2018 no changes Review of Systems  Constitutional: Positive for activity change.  Respiratory: Positive for shortness of breath. Negative for cough.   Musculoskeletal: Positive for arthralgias and back pain.  Neurological: Positive for headaches.  Psychiatric/Behavioral: The patient is nervous/anxious.   All other systems reviewed and are negative.   All other systems reviewed and are negative.   Past Medical History:  Diagnosis Date  . Anxiety   . COPD (chronic obstructive pulmonary disease) (HCC)   . Hypercholesteremia   . Hypertension   . Migraine     Past Surgical History:  Procedure Laterality Date  . BACK SURGERY     x2  . TOTAL ABDOMINAL HYSTERECTOMY    . TRIGGER FINGER RELEASE Right 10/24/2016   Procedure: RIGHT RING FINGER TRIGGER RELEASE;  Surgeon: Vickki Hearing, MD;  Location: [facility];  Service: Orthopedics;  Laterality: Right;    Family History  Problem Relation Age of Onset  . Stroke Mother   . Diabetes Mother   . Liver disease Father        alocholic liver cirrhosis  . Liver cancer Father   . Cancer Father   . Stroke Maternal  Grandmother   . Diabetes Son     Social History Social History   Tobacco Use  . Smoking status: Former Smoker    Years: 20.00    Types: Cigarettes    Last attempt to quit: 09/21/2016    Years since quitting: 1.7  . Smokeless tobacco: Never Used  Substance Use Topics  . Alcohol use: No    Alcohol/week: 0.0 standard drinks  . Drug use: No    Allergies  Allergen Reactions  . Morphine And Related Nausea And Vomiting    Current Outpatient Medications  Medication Sig Dispense Refill  . albuterol (PROVENTIL) (2.5 MG/3ML) 0.083% nebulizer solution Inhale 3 mLs into the lungs every 6 (six) hours as needed for shortness of breath.    . ALPRAZolam (XANAX) 1 MG tablet Take 1 mg by mouth 4 (four) times daily.   5  . atorvastatin (LIPITOR) 80 MG tablet Take 80 mg by mouth daily.     Samantha Carey buPROPion (WELLBUTRIN XL) 300 MG 24 hr tablet Take 300 mg by mouth daily.     . citalopram (CELEXA) 40 MG tablet Take 40 mg by mouth daily.  1  . diphenhydrAMINE (BENADRYL) 25 MG tablet Take 25 mg by mouth every 6 (six) hours as needed for allergies.    Samantha Carey gabapentin (NEURONTIN) 800 MG tablet Take 1 tablet by mouth 3 (three) times daily.    Samantha Carey ibuprofen (  ADVIL,MOTRIN) 800 MG tablet Take 1 tablet (800 mg total) by mouth every 8 (eight) hours as needed. 90 tablet 1  . metoprolol (TOPROL-XL) 200 MG 24 hr tablet Take 100 mg by mouth daily.     . nitrofurantoin, macrocrystal-monohydrate, (MACROBID) 100 MG capsule Take 100 mg by mouth daily.    . Oxycodone HCl 20 MG TABS Take 20 mg by mouth 4 (four) times daily as needed for pain.    . SYMBICORT 160-4.5 MCG/ACT inhaler Inhale 2 puffs into the lungs 2 (two) times daily.    Samantha Carey triamterene-hydrochlorothiazide (MAXZIDE) 75-50 MG tablet Take 1 tablet by mouth daily.     No current facility-administered medications for this visit.    Physical Exam  Constitutional: She is oriented to person, place, and time. She appears well-developed and well-nourished. No distress.   Cardiovascular: Intact distal pulses.  Neurological: She is alert and oriented to person, place, and time. Gait normal.  Psychiatric: She has a normal mood and affect. Judgment normal.  Vitals reviewed. Gait is unsupported no limp is noted  Right and left upper extremity show no contracture dislocation atrophy or tremor  The left knee goes into full extension full flexion no pain tenderness or swelling no crepitation ligaments are stable muscle strength and tone are normal skin is intact pulses excellent no edema temperature of the leg is normal she has normal sensation  On the right side she has tenderness in 3 areas on the medial side of the knee medial femoral condyle posterior medial corner of the joint line and in the medial pes bursa with swelling her knee flexion is limited to 120 degrees and she has a 5 degree deficit in extension ligaments are stable in all planes muscle strength and tone is normal skin is warm dry and intact without lesion ulceration she has altered sensation in the leg which may be related to her lumbar spine pulse perfusion temperature of the limb are normal  Images are in the media section of her chart related to her MRI plain films from 2018 show mild degenerative arthritis without secondary bone changes  I reviewed her MRI and found her to have a torn medial meniscus and cartilage loss medial femoral condyle.  Surgical procedure planned: Torn medial meniscus right knee  Arthroscopy right knee partial medial meniscectomy right knee  The procedure has been fully reviewed with the patient; The risks and benefits of surgery have been discussed and explained and understood. Alternative treatment has also been reviewed, questions were encouraged and answered. The postoperative plan is also been reviewed.  Nonsurgical treatment as described in the history and physical section was attempted and unsuccessful and the patient has agreed to proceed with surgical  intervention to improve their situation.  No orders of the defined types were placed in this encounter.   Fuller Canada, MD 06/25/2018 2:41 PM

## 2018-07-03 NOTE — Patient Instructions (Signed)
Samantha Carey  07/03/2018     @PREFPERIOPPHARMACY @   Your procedure is scheduled on  07/09/2018 .  Report to [facility] at  1045  A.M.  Call this number if you have problems the morning of surgery:  [phone]   Remember:  Do not eat or drink after midnight.                        Take these medicines the morning of surgery with A SIP OF WATER  Xanax ( if needed), celexa, gabapentin, oxycodone ( if needed). Use your inhaler before you come.    Do not wear jewelry, make-up or nail polish.  Do not wear lotions, powders, or perfumes, or deodorant.  Do not shave 48 hours prior to surgery.  Men may shave face and neck.  Do not bring valuables to the hospital.  [facility] is not responsible for any belongings or valuables.  Contacts, dentures or bridgework may not be worn into surgery.  Leave your suitcase in the car.  After surgery it may be brought to your room.  For patients admitted to the hospital, discharge time will be determined by your treatment team.  Patients discharged the day of surgery will not be allowed to drive home.   Name and phone number of your driver:   family Special instructions:  None  Please read over the following fact sheets that you were given. Anesthesia Post-op Instructions and Care and Recovery After Surgery      Knee Ligament Injury, Arthroscopy Arthroscopy is a surgical technique in which your health care provider examines your knee through a small, pencil-sized telescope (arthroscope). Often, repairs to injured ligaments can be done with instruments in the arthroscope. Arthroscopy is less invasive than open-knee surgery. Tell a health care provider about:  Any allergies you have.  All medicines you are taking, including vitamins, herbs, eye drops, creams, and over-the-counter medicines.  Any problems you or family members have had with anesthetic medicines.  Any blood disorders you have.  Any surgeries you have had.  Any  medical conditions you have. What are the risks? Generally, this is a safe procedure. However, as with any procedure, problems can occur. Possible problems include:  Infection.  Bleeding.  Stiffness.  What happens before the procedure?  Ask your health care provider about changing or stopping any regular medicines. Avoid taking aspirin or blood thinners as directed by your health care provider.  Do not eat or drink anything after midnight the night before surgery.  If you smoke, do not smoke for at least 2 weeks before your surgery.  Do not drink alcohol starting the day before your surgery.  Let your health care provider know if you develop a cold or any infection before your surgery.  Arrange for someone to drive you home after the surgery or after your hospital stay. Also arrange for someone to help you with activities during recovery. What happens during the procedure?  Small monitors will be put on your body. They are used to check your heart, blood pressure, and oxygen levels.  An IV access tube will be put into one of your veins. Medicine will be able to flow directly into your body through this IV tube.  You might be given a medicine to help you relax (sedative).  You will be given a medicine that makes you go to sleep (general anesthetic), and a breathing tube  will be placed into your lungs during the procedure.  Several small incisions are made in your knee. Saline fluid is placed into one of the incisions to expand the knee and clear away any blood in the knee.  Your health care provider will insert the arthroscope to examine the injured knee.  During arthroscopy, your health care provider may find a partial or complete tear in a ligament.  Tools can be inserted through the other incisions to repair the injured ligaments.  The incisions are then closed with absorbable stitches and covered with dressings. What happens after the procedure?  You will be taken to  the recovery area where you will be monitored.  When you are awake, stable, and taking fluids without problems, you will be allowed to go home. This information is not intended to replace advice given to you by your health care provider. Make sure you discuss any questions you have with your health care provider. Document Released: 08/16/2000 Document Revised: 01/25/2016 Document Reviewed: 03/31/2013 Elsevier Interactive Patient Education  2017 [employer]  Arthroscopic Knee Ligament Repair, Care After This sheet gives you information about how to care for yourself after your procedure. Your health care provider may also give you more specific instructions. If you have problems or questions, contact your health care provider. What can I expect after the procedure? After the procedure, it is common to have:  Pain in your knee.  Bruising and swelling on your knee, calf, and ankle for 3-4 days.  Fatigue.  Follow these instructions at home: If you have a brace or immobilizer:  Wear the brace or immobilizer as told by your health care provider. Remove it only as told by your health care provider.  Loosen the splint or immobilizer if your toes tingle, become numb, or turn cold and blue.  Keep the brace or immobilizer clean. Bathing  Do not take baths, swim, or use a hot tub until your health care provider approves. Ask your health care provider if you can take showers.  Keep your bandage (dressing) dry until your health care provider says that it can be removed. Cover it and your brace or immobilizer with a watertight covering when you take a shower. Incision care  Follow instructions from your health care provider about how to take care of your incision. Make sure you: ? Wash your hands with soap and water before you change your bandage (dressing). If soap and water are not available, use hand sanitizer. ? Change your dressing as told by your health care provider. ? Leave stitches  (sutures), skin glue, or adhesive strips in place. These skin closures may need to stay in place for 2 weeks or longer. If adhesive strip edges start to loosen and curl up, you may trim the loose edges. Do not remove adhesive strips completely unless your health care provider tells you to do that.  Check your incision area every day for signs of infection. Check for: ? More redness, swelling, or pain. ? More fluid or blood. ? Warmth. ? Pus or a bad smell. Managing pain, stiffness, and swelling  If directed, put ice on the affected area. ? If you have a removable brace or immobilizer, remove it as told by your health care provider. ? Put ice in a plastic bag. ? Place a towel between your skin and the bag or between your brace or immobilizer and the bag. ? Leave the ice on for 20 minutes, 2-3 times a day.  Move your toes  often to avoid stiffness and to lessen swelling.  Raise (elevate) the injured area above the level of your heart while you are sitting or lying down. Driving  Do not drive until your health care provider approves. If you have a brace or immobilizer on your leg, ask your health care provider when it is safe for you to drive.  Do not drive or use heavy machinery while taking prescription pain medicine. Activity  Rest as directed. Ask your health care provider what activities are safe for you.  Do physical therapy exercises as told by your health care provider. Physical therapy will help you regain strength and motion in your knee.  Follow instructions from your health care provider about: ? When you may start motion exercises. ? When you may start riding a stationary bike and doing other low-impact activities. ? When you may start to jog and do other high-impact activities. Safety  Do not use the injured limb to support your body weight until your health care provider says that you can. Use crutches as told by your health care provider. General instructions  Do not  use any products that contain nicotine or tobacco, such as cigarettes and e-cigarettes. These can delay bone healing. If you need help quitting, ask your health care provider.  To prevent or treat constipation while you are taking prescription pain medicine, your health care provider may recommend that you: ? Drink enough fluid to keep your urine clear or pale yellow. ? Take over-the-counter or prescription medicines. ? Eat foods that are high in fiber, such as fresh fruits and vegetables, whole grains, and beans. ? Limit foods that are high in fat and processed sugars, such as fried and sweet foods.  Take over-the-counter and prescription medicines only as told by your health care provider.  Keep all follow-up visits as told by your health care provider. This is important. Contact a health care provider if:  You have more redness, swelling, or pain around an incision.  You have more fluid or blood coming from an incision.  Your incision feels warm to the touch.  You have a fever.  You have pain or swelling in your knee, and it gets worse.  You have pain that does not get better with medicine. Get help right away if:  You have trouble breathing.  You have pus or a bad smell coming from an incision.  You have numbness and tingling near the knee joint. Summary  After the procedure, it is common to have knee pain with bruising and swelling on your knee, calf, and ankle.  Icing your knee and raising your leg above the level of your heart will help control the pain and the swelling.  Do physical therapy exercises as told by your health care provider. Physical therapy will help you regain strength and motion in your knee. This information is not intended to replace advice given to you by your health care provider. Make sure you discuss any questions you have with your health care provider. Document Released: 06/09/2013 Document Revised: 08/13/2016 Document Reviewed:  08/13/2016 Elsevier Interactive Patient Education  2017 [employer] Inc.  General Anesthesia, Adult General anesthesia is the use of medicines to make a person "go to sleep" (be unconscious) for a medical procedure. General anesthesia is often recommended when a procedure:  Is long.  Requires you to be still or in an unusual position.  Is major and can cause you to lose blood.  Is impossible to do without general anesthesia.  The medicines used for general anesthesia are called general anesthetics. In addition to making you sleep, the medicines:  Prevent pain.  Control your blood pressure.  Relax your muscles.  Tell a health care provider about:  Any allergies you have.  All medicines you are taking, including vitamins, herbs, eye drops, creams, and over-the-counter medicines.  Any problems you or family members have had with anesthetic medicines.  Types of anesthetics you have had in the past.  Any bleeding disorders you have.  Any surgeries you have had.  Any medical conditions you have.  Any history of heart or lung conditions, such as heart failure, sleep apnea, or chronic obstructive pulmonary disease (COPD).  Whether you are pregnant or may be pregnant.  Whether you use tobacco, alcohol, marijuana, or street drugs.  Any history of Financial planner.  Any history of depression or anxiety. What are the risks? Generally, this is a safe procedure. However, problems may occur, including:  Allergic reaction to anesthetics.  Lung and heart problems.  Inhaling food or liquids from your stomach into your lungs (aspiration).  Injury to nerves.  Waking up during your procedure and being unable to move (rare).  Extreme agitation or a state of mental confusion (delirium) when you wake up from the anesthetic.  Air in the bloodstream, which can lead to stroke.  These problems are more likely to develop if you are having a major surgery or if you have an advanced  medical condition. You can prevent some of these complications by answering all of your health care provider's questions thoroughly and by following all pre-procedure instructions. General anesthesia can cause side effects, including:  Nausea or vomiting  A sore throat from the breathing tube.  Feeling cold or shivery.  Feeling tired, washed out, or achy.  Sleepiness or drowsiness.  Confusion or agitation.  What happens before the procedure? Staying hydrated Follow instructions from your health care provider about hydration, which may include:  Up to 2 hours before the procedure - you may continue to drink clear liquids, such as water, clear fruit juice, black coffee, and plain tea.  Eating and drinking restrictions Follow instructions from your health care provider about eating and drinking, which may include:  8 hours before the procedure - stop eating heavy meals or foods such as meat, fried foods, or fatty foods.  6 hours before the procedure - stop eating light meals or foods, such as toast or cereal.  6 hours before the procedure - stop drinking milk or drinks that contain milk.  2 hours before the procedure - stop drinking clear liquids.  Medicines  Ask your health care provider about: ? Changing or stopping your regular medicines. This is especially important if you are taking diabetes medicines or blood thinners. ? Taking medicines such as aspirin and ibuprofen. These medicines can thin your blood. Do not take these medicines before your procedure if your health care provider instructs you not to. ? Taking new dietary supplements or medicines. Do not take these during the week before your procedure unless your health care provider approves them.  If you are told to take a medicine or to continue taking a medicine on the day of the procedure, take the medicine with sips of water. General instructions   Ask if you will be going home the same day, the following day,  or after a longer hospital stay. ? Plan to have someone take you home. ? Plan to have someone stay with you for the  first 24 hours after you leave the hospital or clinic.  For 3-6 weeks before the procedure, try not to use any tobacco products, such as cigarettes, chewing tobacco, and e-cigarettes.  You may brush your teeth on the morning of the procedure, but make sure to spit out the toothpaste. What happens during the procedure?  You will be given anesthetics through a mask and through an IV tube in one of your veins.  You may receive medicine to help you relax (sedative).  As soon as you are asleep, a breathing tube may be used to help you breathe.  An anesthesia specialist will stay with you throughout the procedure. He or she will help keep you comfortable and safe by continuing to give you medicines and adjusting the amount of medicine that you get. He or she will also watch your blood pressure, pulse, and oxygen levels to make sure that the anesthetics do not cause any problems.  If a breathing tube was used to help you breathe, it will be removed before you wake up. The procedure may vary among health care providers and hospitals. What happens after the procedure?  You will wake up, often slowly, after the procedure is complete, usually in a recovery area.  Your blood pressure, heart rate, breathing rate, and blood oxygen level will be monitored until the medicines you were given have worn off.  You may be given medicine to help you calm down if you feel anxious or agitated.  If you will be going home the same day, your health care provider may check to make sure you can stand, drink, and urinate.  Your health care providers will treat your pain and side effects before you go home.  Do not drive for 24 hours if you received a sedative.  You may: ? Feel nauseous and vomit. ? Have a sore throat. ? Have mental slowness. ? Feel cold or shivery. ? Feel sleepy. ? Feel  tired. ? Feel sore or achy, even in parts of your body where you did not have surgery. This information is not intended to replace advice given to you by your health care provider. Make sure you discuss any questions you have with your health care provider. Document Released: 11/26/2007 Document Revised: 01/30/2016 Document Reviewed: 08/03/2015 Elsevier Interactive Patient Education  2018 [employer]. General Anesthesia, Adult, Care After These instructions provide you with information about caring for yourself after your procedure. Your health care provider may also give you more specific instructions. Your treatment has been planned according to current medical practices, but problems sometimes occur. Call your health care provider if you have any problems or questions after your procedure. What can I expect after the procedure? After the procedure, it is common to have:  Vomiting.  A sore throat.  Mental slowness.  It is common to feel:  Nauseous.  Cold or shivery.  Sleepy.  Tired.  Sore or achy, even in parts of your body where you did not have surgery.  Follow these instructions at home: For at least 24 hours after the procedure:  Do not: ? Participate in activities where you could fall or become injured. ? Drive. ? Use heavy machinery. ? Drink alcohol. ? Take sleeping pills or medicines that cause drowsiness. ? Make important decisions or sign legal documents. ? Take care of children on your own.  Rest. Eating and drinking  If you vomit, drink water, juice, or soup when you can drink without vomiting.  Drink enough fluid to  keep your urine clear or pale yellow.  Make sure you have little or no nausea before eating solid foods.  Follow the diet recommended by your health care provider. General instructions  Have a responsible adult stay with you until you are awake and alert.  Return to your normal activities as told by your health care provider. Ask your  health care provider what activities are safe for you.  Take over-the-counter and prescription medicines only as told by your health care provider.  If you smoke, do not smoke without supervision.  Keep all follow-up visits as told by your health care provider. This is important. Contact a health care provider if:  You continue to have nausea or vomiting at home, and medicines are not helpful.  You cannot drink fluids or start eating again.  You cannot urinate after 8-12 hours.  You develop a skin rash.  You have fever.  You have increasing redness at the site of your procedure. Get help right away if:  You have difficulty breathing.  You have chest pain.  You have unexpected bleeding.  You feel that you are having a life-threatening or urgent problem. This information is not intended to replace advice given to you by your health care provider. Make sure you discuss any questions you have with your health care provider. Document Released: 11/25/2000 Document Revised: 01/22/2016 Document Reviewed: 08/03/2015 Elsevier Interactive Patient Education  [employer].

## 2018-07-06 ENCOUNTER — Encounter (HOSPITAL_COMMUNITY)
Admission: RE | Admit: 2018-07-06 | Discharge: 2018-07-06 | Disposition: A | Discharge status: Home / Self Care | Payer: Medicare HMO | Source: Ambulatory Visit | Attending: Orthopedic Surgery | Admitting: Orthopedic Surgery

## 2018-07-06 ENCOUNTER — Telehealth: Payer: Self-pay | Admitting: Orthopedic Surgery

## 2018-07-06 ENCOUNTER — Encounter (HOSPITAL_COMMUNITY): Payer: Self-pay

## 2018-07-06 NOTE — Telephone Encounter (Signed)
That is fine, I have asked him to call me in Dec to schedule in January on a Thursday. Left message.

## 2018-07-06 NOTE — Telephone Encounter (Signed)
Patient's husband Systems analyst) called stating that he would like to cancel the surgery until January. He won't have any time to take to look after his wife until then. He would like to have it scheduled sometime towards the end of January.  Please call and advise

## 2018-07-09 ENCOUNTER — Ambulatory Visit (HOSPITAL_COMMUNITY): Admission: RE | Admit: 2018-07-09 | Payer: Medicare HMO | Source: Ambulatory Visit | Admitting: Orthopedic Surgery

## 2018-07-09 ENCOUNTER — Encounter (HOSPITAL_COMMUNITY): Admission: RE | Payer: Self-pay | Source: Ambulatory Visit

## 2018-07-09 SURGERY — ARTHROSCOPY, KNEE, WITH MEDIAL MENISCECTOMY
Anesthesia: Choice | Laterality: Right

## 2018-07-15 ENCOUNTER — Telehealth: Payer: Self-pay | Admitting: Orthopedic Surgery

## 2018-07-15 NOTE — Telephone Encounter (Signed)
No I will just call them and set a date

## 2018-07-15 NOTE — Telephone Encounter (Signed)
Patient and spouse, designated contact for patient, asking about waiting until latter part of January for re-scheduling of surgery. Would patient need another appointment in office if waiting until then?

## 2018-07-16 NOTE — Telephone Encounter (Signed)
Called to set a date, no answer

## 2018-07-17 ENCOUNTER — Ambulatory Visit: Payer: Self-pay | Admitting: Orthopedic Surgery

## 2018-08-03 NOTE — Telephone Encounter (Signed)
Left message for her to call me back and we will RS

## 2018-11-12 ENCOUNTER — Telehealth: Payer: Self-pay | Admitting: Orthopedic Surgery

## 2018-11-12 NOTE — Telephone Encounter (Signed)
Mr. Brutus called and said they want to discuss about rescheduling Samantha Carey's surgery.  They want to know if she needs to come back in first.  Please call them at [phone]

## 2018-11-13 NOTE — Telephone Encounter (Signed)
Patient was sch for knee scope in November / cancelled  do you need to see her again first? Or can I reschedule the surgery ?

## 2018-11-13 NOTE — Telephone Encounter (Signed)
Lets see again

## 2018-12-30 ENCOUNTER — Ambulatory Visit: Payer: Self-pay | Admitting: Orthopedic Surgery

## 2019-01-04 ENCOUNTER — Emergency Department (HOSPITAL_COMMUNITY)
Admission: EM | Admit: 2019-01-04 | Discharge: 2019-01-04 | Disposition: A | Discharge status: Home / Self Care | Payer: Medicare HMO | Attending: Emergency Medicine | Admitting: Emergency Medicine

## 2019-01-04 ENCOUNTER — Encounter (HOSPITAL_COMMUNITY): Payer: Self-pay

## 2019-01-04 ENCOUNTER — Other Ambulatory Visit: Payer: Self-pay

## 2019-01-04 DIAGNOSIS — R070 Pain in throat: Secondary | ICD-10-CM | POA: Insufficient documentation

## 2019-01-04 DIAGNOSIS — R062 Wheezing: Secondary | ICD-10-CM | POA: Insufficient documentation

## 2019-01-04 DIAGNOSIS — Z79899 Other long term (current) drug therapy: Secondary | ICD-10-CM | POA: Insufficient documentation

## 2019-01-04 DIAGNOSIS — J449 Chronic obstructive pulmonary disease, unspecified: Secondary | ICD-10-CM | POA: Diagnosis not present

## 2019-01-04 DIAGNOSIS — Z87891 Personal history of nicotine dependence: Secondary | ICD-10-CM | POA: Diagnosis not present

## 2019-01-04 DIAGNOSIS — I10 Essential (primary) hypertension: Secondary | ICD-10-CM | POA: Insufficient documentation

## 2019-01-04 DIAGNOSIS — J029 Acute pharyngitis, unspecified: Secondary | ICD-10-CM

## 2019-01-04 LAB — GROUP A STREP BY PCR: Group A Strep by PCR: NOT DETECTED

## 2019-01-04 NOTE — ED Triage Notes (Addendum)
Pt reports sore throat for 7 days. Denies any other symptoms. Pain is worse on left side

## 2019-01-04 NOTE — ED Notes (Signed)
Pt ambulated with pulse ox, 89-90% during ambulation. 93 % when she sat down in room.

## 2019-01-04 NOTE — ED Provider Notes (Signed)
[facility] EMERGENCY DEPARTMENT Provider Note   CSN: [id number] Arrival date & time: 01/04/19  1637    History   Chief Complaint Chief Complaint  Patient presents with  . Sore Throat    HPI Samantha Carey is a 73 y.o. female.     HPI  The patient is a 73 year old female, she has a known history of COPD, anxiety, migraine headaches and hypertension.  She presents to the hospital with a sore throat which is been present for approximately 6 days according to the patient's report.  This seems to be intermittent, it comes and goes, nothing seems to make it better or worse and she is able to swallow food without any difficulty.  She has no fevers, chills, coughing, shortness of breath, earache, nasal congestion, stiffness of the neck or rash.  There is no dental pain, no other complaints.  Past Medical History:  Diagnosis Date  . Anxiety   . COPD (chronic obstructive pulmonary disease) (HCC)   . Hypercholesteremia   . Hypertension   . Migraine     Patient Active Problem List   Diagnosis Date Noted  . Status post trigger finger release 10/28/2016  . Trigger ring finger of right hand     Past Surgical History:  Procedure Laterality Date  . BACK SURGERY     x2  . TOTAL ABDOMINAL HYSTERECTOMY    . TRIGGER FINGER RELEASE Right 10/24/2016   Procedure: RIGHT RING FINGER TRIGGER RELEASE;  Surgeon: Vickki Hearing, MD;  Location: [facility];  Service: Orthopedics;  Laterality: Right;     OB History    Gravida  3   Para  3   Term  3   Preterm      AB      Living        SAB      TAB      Ectopic      Multiple      Live Births               Home Medications    Prior to Admission medications   Medication Sig Start Date End Date Taking? Authorizing Provider  ALPRAZolam Prudy Feeler) 1 MG tablet Take 1 mg by mouth 4 (four) times daily.  09/29/14   [provider]  atorvastatin (LIPITOR) 80 MG tablet Take 80 mg by mouth daily.  04/23/16   [provider]  citalopram (CELEXA) 40 MG tablet Take 40 mg by mouth daily. 06/09/18   [provider]  gabapentin (NEURONTIN) 800 MG tablet Take 800 mg by mouth 4 (four) times daily.  03/01/16   [provider]  ibuprofen (ADVIL,MOTRIN) 800 MG tablet Take 1 tablet (800 mg total) by mouth every 8 (eight) hours as needed. 10/24/16   Vickki Hearing, MD  Oxycodone HCl 20 MG TABS Take 20 mg by mouth 4 (four) times daily as needed (pain).  09/29/16   [provider]  SYMBICORT 160-4.5 MCG/ACT inhaler Inhale 2 puffs into the lungs daily.  09/13/16   [provider]    Family History Family History  Problem Relation Age of Onset  . Stroke Mother   . Diabetes Mother   . Liver disease Father        alocholic liver cirrhosis  . Liver cancer Father   . Cancer Father   . Stroke Maternal Grandmother   . Diabetes Son     Social History Social History   Tobacco Use  . Smoking status: Former  Smoker    Years: 20.00    Types: Cigarettes    Last attempt to quit: 09/21/2016    Years since quitting: 2.2  . Smokeless tobacco: Never Used  Substance Use Topics  . Alcohol use: No    Alcohol/week: 0.0 standard drinks  . Drug use: No     Allergies   Morphine and related   Review of Systems Review of Systems  Constitutional: Negative for fever.  HENT: Positive for sore throat. Negative for ear pain and rhinorrhea.   Respiratory: Negative for cough and shortness of breath.   Cardiovascular: Negative for leg swelling.  Gastrointestinal: Negative for diarrhea, nausea and vomiting.  Neurological: Negative for numbness and headaches.     Physical Exam Updated Vital Signs BP 106/66   Pulse 81   Temp 99.8 F (37.7 C) (Oral)   Resp 17   SpO2 (!) 89%   Physical Exam Vitals signs and nursing note reviewed.  Constitutional:      Appearance: She is well-developed. She is not diaphoretic.  HENT:     Head: Normocephalic and atraumatic.     Right Ear: No drainage,  swelling or tenderness.     Left Ear: No drainage, swelling or tenderness.     Nose: No congestion or rhinorrhea.     Mouth/Throat:     Mouth: No oral lesions.     Pharynx: No pharyngeal swelling, oropharyngeal exudate, posterior oropharyngeal erythema or uvula swelling.     Tonsils: No tonsillar exudate or tonsillar abscesses.  Eyes:     General:        Right eye: No discharge.        Left eye: No discharge.     Conjunctiva/sclera: Conjunctivae normal.  Neck:     Comments: Very supple neck, no lymphadenopathy, no trismus or torticollis Cardiovascular:     Rate and Rhythm: Normal rate and regular rhythm.  Pulmonary:     Effort: Pulmonary effort is normal. No respiratory distress.     Breath sounds: Wheezing ( Minimal expiratory) present.  Skin:    General: Skin is warm and dry.     Findings: No erythema or rash.  Neurological:     Mental Status: She is alert.     Coordination: Coordination normal.      ED Treatments / Results  Labs (all labs ordered are listed, but only abnormal results are displayed) Labs Reviewed  GROUP A STREP BY PCR    EKG None  Radiology No results found.  Procedures Procedures (including critical care time)  Medications Ordered in ED Medications - No data to display   Initial Impression / Assessment and Plan / ED Course  I have reviewed the triage vital signs and the nursing notes.  Pertinent labs & imaging results that were available during my care of the patient were reviewed by me and considered in my medical decision making (see chart for details).        Check for strep, the patient is otherwise well-appearing, she has no other symptoms, this is unlikely to be something more serious, no signs of Ludwig's angina or deep tissue infection  Oxygen is 93% on room air on my exam.  She is not feeling short of breath and has no cough, there is only slight wheezing.  Strep test negative, patient very stable appearing, stable for  discharge.  Final Clinical Impressions(s) / ED Diagnoses   Final diagnoses:  Sore throat    ED Discharge Orders    None  Eber HongMiller, Charlena Haub, MD 01/04/19 2018

## 2019-01-04 NOTE — Discharge Instructions (Signed)
Your testing today shows no signs of strep throat, there is no signs of swelling on your tonsils, your testing is negative for strep throat.  Please seek medical exam if you should develop severe or worsening symptoms otherwise this should gradually get better.  Tylenol or ibuprofen as needed for pain

## 2019-02-10 ENCOUNTER — Ambulatory Visit: Payer: Self-pay | Admitting: Orthopedic Surgery

## 2019-02-16 ENCOUNTER — Other Ambulatory Visit: Payer: Self-pay

## 2019-02-16 ENCOUNTER — Observation Stay (HOSPITAL_COMMUNITY): Payer: Medicare HMO

## 2019-02-16 ENCOUNTER — Inpatient Hospital Stay (HOSPITAL_COMMUNITY)
Admission: EM | Admit: 2019-02-16 | Discharge: 2019-02-20 | DRG: 871 | Discharge status: Against Medical Advice | Payer: Medicare HMO | Attending: Internal Medicine | Admitting: Internal Medicine

## 2019-02-16 ENCOUNTER — Encounter (HOSPITAL_COMMUNITY): Payer: Self-pay | Admitting: *Deleted

## 2019-02-16 ENCOUNTER — Emergency Department (HOSPITAL_COMMUNITY): Payer: Medicare HMO

## 2019-02-16 DIAGNOSIS — A419 Sepsis, unspecified organism: Principal | ICD-10-CM | POA: Diagnosis present

## 2019-02-16 DIAGNOSIS — G894 Chronic pain syndrome: Secondary | ICD-10-CM | POA: Diagnosis present

## 2019-02-16 DIAGNOSIS — Z5329 Procedure and treatment not carried out because of patient's decision for other reasons: Secondary | ICD-10-CM | POA: Diagnosis not present

## 2019-02-16 DIAGNOSIS — R7881 Bacteremia: Secondary | ICD-10-CM

## 2019-02-16 DIAGNOSIS — J449 Chronic obstructive pulmonary disease, unspecified: Secondary | ICD-10-CM | POA: Diagnosis present

## 2019-02-16 DIAGNOSIS — Z79899 Other long term (current) drug therapy: Secondary | ICD-10-CM

## 2019-02-16 DIAGNOSIS — F419 Anxiety disorder, unspecified: Secondary | ICD-10-CM | POA: Diagnosis present

## 2019-02-16 DIAGNOSIS — T50915A Adverse effect of multiple unspecified drugs, medicaments and biological substances, initial encounter: Secondary | ICD-10-CM | POA: Diagnosis not present

## 2019-02-16 DIAGNOSIS — Z7951 Long term (current) use of inhaled steroids: Secondary | ICD-10-CM

## 2019-02-16 DIAGNOSIS — K5900 Constipation, unspecified: Secondary | ICD-10-CM | POA: Diagnosis present

## 2019-02-16 DIAGNOSIS — R509 Fever, unspecified: Secondary | ICD-10-CM | POA: Diagnosis not present

## 2019-02-16 DIAGNOSIS — E785 Hyperlipidemia, unspecified: Secondary | ICD-10-CM | POA: Diagnosis present

## 2019-02-16 DIAGNOSIS — G8929 Other chronic pain: Secondary | ICD-10-CM

## 2019-02-16 DIAGNOSIS — N19 Unspecified kidney failure: Secondary | ICD-10-CM | POA: Diagnosis present

## 2019-02-16 DIAGNOSIS — M5441 Lumbago with sciatica, right side: Secondary | ICD-10-CM

## 2019-02-16 DIAGNOSIS — M5442 Lumbago with sciatica, left side: Secondary | ICD-10-CM

## 2019-02-16 DIAGNOSIS — F329 Major depressive disorder, single episode, unspecified: Secondary | ICD-10-CM | POA: Diagnosis present

## 2019-02-16 DIAGNOSIS — I10 Essential (primary) hypertension: Secondary | ICD-10-CM | POA: Diagnosis present

## 2019-02-16 DIAGNOSIS — G9341 Metabolic encephalopathy: Secondary | ICD-10-CM

## 2019-02-16 DIAGNOSIS — K2970 Gastritis, unspecified, without bleeding: Secondary | ICD-10-CM | POA: Diagnosis present

## 2019-02-16 DIAGNOSIS — M549 Dorsalgia, unspecified: Secondary | ICD-10-CM | POA: Diagnosis present

## 2019-02-16 DIAGNOSIS — E876 Hypokalemia: Secondary | ICD-10-CM | POA: Diagnosis not present

## 2019-02-16 DIAGNOSIS — G92 Toxic encephalopathy: Secondary | ICD-10-CM | POA: Diagnosis not present

## 2019-02-16 DIAGNOSIS — Y92230 Patient room in hospital as the place of occurrence of the external cause: Secondary | ICD-10-CM | POA: Diagnosis not present

## 2019-02-16 DIAGNOSIS — Z87891 Personal history of nicotine dependence: Secondary | ICD-10-CM

## 2019-02-16 DIAGNOSIS — R21 Rash and other nonspecific skin eruption: Secondary | ICD-10-CM | POA: Diagnosis present

## 2019-02-16 DIAGNOSIS — Z79891 Long term (current) use of opiate analgesic: Secondary | ICD-10-CM

## 2019-02-16 DIAGNOSIS — R339 Retention of urine, unspecified: Secondary | ICD-10-CM | POA: Diagnosis present

## 2019-02-16 DIAGNOSIS — Z885 Allergy status to narcotic agent status: Secondary | ICD-10-CM

## 2019-02-16 DIAGNOSIS — E860 Dehydration: Secondary | ICD-10-CM | POA: Diagnosis present

## 2019-02-16 DIAGNOSIS — Z20828 Contact with and (suspected) exposure to other viral communicable diseases: Secondary | ICD-10-CM | POA: Diagnosis present

## 2019-02-16 LAB — LACTIC ACID, PLASMA
Lactic Acid, Venous: 2.3 mmol/L (ref 0.5–1.9)
Lactic Acid, Venous: 2.1 mmol/L (ref 0.5–1.9)

## 2019-02-16 LAB — CBC WITH DIFFERENTIAL/PLATELET
Abs Immature Granulocytes: 0.06 10*3/uL (ref 0.00–0.07)
Basophils Absolute: 0 10*3/uL (ref 0.0–0.1)
Basophils Relative: 0 %
Eosinophils Absolute: 0.2 10*3/uL (ref 0.0–0.5)
Eosinophils Relative: 1 %
HCT: 42.8 % (ref 36.0–46.0)
Hemoglobin: 13.9 g/dL (ref 12.0–15.0)
Immature Granulocytes: 0 %
Lymphocytes Relative: 9 %
Lymphs Abs: 1.4 10*3/uL (ref 0.7–4.0)
MCH: 28.7 pg (ref 26.0–34.0)
MCHC: 32.5 g/dL (ref 30.0–36.0)
MCV: 88.4 fL (ref 80.0–100.0)
Monocytes Absolute: 0.7 10*3/uL (ref 0.1–1.0)
Monocytes Relative: 4 %
Neutro Abs: 13.8 10*3/uL — ABNORMAL HIGH (ref 1.7–7.7)
Neutrophils Relative %: 86 %
Platelets: 241 10*3/uL (ref 150–400)
RBC: 4.84 MIL/uL (ref 3.87–5.11)
RDW: 13.2 % (ref 11.5–15.5)
WBC: 16.2 10*3/uL — ABNORMAL HIGH (ref 4.0–10.5)
nRBC: 0 % (ref 0.0–0.2)

## 2019-02-16 LAB — URINALYSIS, ROUTINE W REFLEX MICROSCOPIC
Bilirubin Urine: NEGATIVE
Glucose, UA: NEGATIVE mg/dL
Hgb urine dipstick: NEGATIVE
Ketones, ur: NEGATIVE mg/dL
Leukocytes,Ua: NEGATIVE
Nitrite: NEGATIVE
Protein, ur: NEGATIVE mg/dL
Specific Gravity, Urine: 1.009 (ref 1.005–1.030)
pH: 6 (ref 5.0–8.0)

## 2019-02-16 LAB — COMPREHENSIVE METABOLIC PANEL
ALT: 22 U/L (ref 0–44)
AST: 23 U/L (ref 15–41)
Albumin: 3.8 g/dL (ref 3.5–5.0)
Alkaline Phosphatase: 125 U/L (ref 38–126)
Anion gap: 12 (ref 5–15)
BUN: 12 mg/dL (ref 8–23)
CO2: 22 mmol/L (ref 22–32)
Calcium: 9.3 mg/dL (ref 8.9–10.3)
Chloride: 105 mmol/L (ref 98–111)
Creatinine, Ser: 0.84 mg/dL (ref 0.44–1.00)
GFR calc Af Amer: >60 mL/min (ref >60)
GFR calc non Af Amer: >60 mL/min (ref >60)
Glucose, Bld: 124 mg/dL — ABNORMAL HIGH (ref 70–99)
Potassium: 4 mmol/L (ref 3.5–5.1)
Sodium: 139 mmol/L (ref 135–145)
Total Bilirubin: 0.8 mg/dL (ref 0.3–1.2)
Total Protein: 6.8 g/dL (ref 6.5–8.1)

## 2019-02-16 LAB — LIPASE, BLOOD: Lipase: 21 U/L (ref 11–51)

## 2019-02-16 LAB — SARS CORONAVIRUS 2 BY RT PCR (HOSPITAL ORDER, PERFORMED IN ~~LOC~~ HOSPITAL LAB): SARS Coronavirus 2: NEGATIVE

## 2019-02-16 MED ORDER — TRAZODONE HCL 50 MG PO TABS
100.0000 mg | ORAL_TABLET | Freq: Every evening | ORAL | Status: DC | PRN
  Administered 2019-02-17: 22:00:00 100 mg via ORAL
  Filled 2019-02-16: qty 2

## 2019-02-16 MED ORDER — ACETAMINOPHEN 500 MG PO TABS
1000.0000 mg | ORAL_TABLET | Freq: Once | ORAL | Status: AC
  Administered 2019-02-16: 1000 mg via ORAL
  Filled 2019-02-16: qty 2

## 2019-02-16 MED ORDER — SENNOSIDES-DOCUSATE SODIUM 8.6-50 MG PO TABS
2.0000 | ORAL_TABLET | Freq: Two times a day (BID) | ORAL | Status: DC
  Administered 2019-02-17 – 2019-02-18 (×3): 2 via ORAL
  Filled 2019-02-16 (×6): qty 2

## 2019-02-16 MED ORDER — ACETAMINOPHEN 325 MG PO TABS
650.0000 mg | ORAL_TABLET | Freq: Four times a day (QID) | ORAL | Status: DC | PRN
  Administered 2019-02-17 (×2): 650 mg via ORAL
  Filled 2019-02-16 (×2): qty 2

## 2019-02-16 MED ORDER — METRONIDAZOLE IN NACL 5-0.79 MG/ML-% IV SOLN
500.0000 mg | Freq: Once | INTRAVENOUS | Status: AC
  Administered 2019-02-16: 500 mg via INTRAVENOUS
  Filled 2019-02-16: qty 100

## 2019-02-16 MED ORDER — HYDRALAZINE HCL 20 MG/ML IJ SOLN: 10.0000 mg | Freq: Four times a day (QID) | INTRAMUSCULAR | Status: DC | PRN

## 2019-02-16 MED ORDER — SODIUM CHLORIDE 0.9% FLUSH
3.0000 mL | Freq: Two times a day (BID) | INTRAVENOUS | Status: DC
  Administered 2019-02-17: 09:00:00 3 mL via INTRAVENOUS

## 2019-02-16 MED ORDER — IOHEXOL 300 MG/ML  SOLN
100.0000 mL | Freq: Once | INTRAMUSCULAR | Status: AC | PRN
  Administered 2019-02-16: 100 mL via INTRAVENOUS

## 2019-02-16 MED ORDER — SODIUM CHLORIDE 0.9 % IV SOLN: 2.0000 g | Freq: Two times a day (BID) | INTRAVENOUS | Status: DC

## 2019-02-16 MED ORDER — VANCOMYCIN HCL IN DEXTROSE 1-5 GM/200ML-% IV SOLN: 1000.0000 mg | Freq: Once | INTRAVENOUS | Status: DC

## 2019-02-16 MED ORDER — ALPRAZOLAM 0.5 MG PO TABS
0.5000 mg | ORAL_TABLET | Freq: Three times a day (TID) | ORAL | Status: DC | PRN
  Administered 2019-02-16 – 2019-02-18 (×3): 0.5 mg via ORAL
  Filled 2019-02-16 (×3): qty 1

## 2019-02-16 MED ORDER — ONDANSETRON HCL 4 MG PO TABS: 4.0000 mg | ORAL_TABLET | Freq: Four times a day (QID) | ORAL | Status: DC | PRN

## 2019-02-16 MED ORDER — POLYETHYLENE GLYCOL 3350 17 G PO PACK
17.0000 g | PACK | Freq: Every day | ORAL | Status: DC
  Administered 2019-02-17 – 2019-02-18 (×2): 17 g via ORAL
  Filled 2019-02-16 (×3): qty 1

## 2019-02-16 MED ORDER — GABAPENTIN 300 MG PO CAPS
600.0000 mg | ORAL_CAPSULE | Freq: Three times a day (TID) | ORAL | Status: DC
  Administered 2019-02-16 – 2019-02-18 (×6): 600 mg via ORAL
  Filled 2019-02-16 (×8): qty 2

## 2019-02-16 MED ORDER — OXYCODONE HCL 5 MG PO TABS
20.0000 mg | ORAL_TABLET | Freq: Four times a day (QID) | ORAL | Status: DC | PRN
  Administered 2019-02-16 – 2019-02-18 (×5): 20 mg via ORAL
  Filled 2019-02-16 (×5): qty 4

## 2019-02-16 MED ORDER — ONDANSETRON HCL 4 MG/2ML IJ SOLN
4.0000 mg | Freq: Four times a day (QID) | INTRAMUSCULAR | Status: DC | PRN
  Administered 2019-02-17 – 2019-02-20 (×3): 4 mg via INTRAVENOUS
  Filled 2019-02-16 (×3): qty 2

## 2019-02-16 MED ORDER — VANCOMYCIN HCL IN DEXTROSE 750-5 MG/150ML-% IV SOLN: 750.0000 mg | Freq: Two times a day (BID) | INTRAVENOUS | Status: DC

## 2019-02-16 MED ORDER — SODIUM CHLORIDE 0.9 % IV SOLN: 250.0000 mL | INTRAVENOUS | Status: DC | PRN

## 2019-02-16 MED ORDER — ATORVASTATIN CALCIUM 40 MG PO TABS
80.0000 mg | ORAL_TABLET | Freq: Every day | ORAL | Status: DC
  Administered 2019-02-17 – 2019-02-19 (×2): 80 mg via ORAL
  Filled 2019-02-16 (×5): qty 2

## 2019-02-16 MED ORDER — KETOROLAC TROMETHAMINE 15 MG/ML IJ SOLN
15.0000 mg | Freq: Four times a day (QID) | INTRAMUSCULAR | Status: DC | PRN
  Administered 2019-02-20: 02:00:00 15 mg via INTRAVENOUS
  Filled 2019-02-16: qty 1

## 2019-02-16 MED ORDER — BUPROPION HCL ER (XL) 300 MG PO TB24
300.0000 mg | ORAL_TABLET | Freq: Every day | ORAL | Status: DC
  Administered 2019-02-17 – 2019-02-19 (×3): 300 mg via ORAL
  Filled 2019-02-16 (×3): qty 1

## 2019-02-16 MED ORDER — MOMETASONE FURO-FORMOTEROL FUM 200-5 MCG/ACT IN AERO
2.0000 | INHALATION_SPRAY | Freq: Two times a day (BID) | RESPIRATORY_TRACT | Status: DC
  Administered 2019-02-16 – 2019-02-20 (×8): 2 via RESPIRATORY_TRACT
  Filled 2019-02-16: qty 8.8

## 2019-02-16 MED ORDER — ACETAMINOPHEN 650 MG RE SUPP: 650.0000 mg | Freq: Four times a day (QID) | RECTAL | Status: DC | PRN

## 2019-02-16 MED ORDER — POLYETHYLENE GLYCOL 3350 17 G PO PACK: 17.0000 g | PACK | Freq: Every day | ORAL | Status: DC | PRN

## 2019-02-16 MED ORDER — GABAPENTIN 800 MG PO TABS: 800.0000 mg | ORAL_TABLET | Freq: Four times a day (QID) | ORAL | Status: DC

## 2019-02-16 MED ORDER — SODIUM CHLORIDE 0.9% FLUSH: 3.0000 mL | INTRAVENOUS | Status: DC | PRN

## 2019-02-16 MED ORDER — TRAZODONE HCL 50 MG PO TABS: 150.0000 mg | ORAL_TABLET | Freq: Every day | ORAL | Status: DC

## 2019-02-16 MED ORDER — VANCOMYCIN HCL 1.5 G IV SOLR
1500.0000 mg | Freq: Once | INTRAVENOUS | Status: AC
  Administered 2019-02-16: 1500 mg via INTRAVENOUS
  Filled 2019-02-16: qty 1500

## 2019-02-16 MED ORDER — SODIUM CHLORIDE 0.9 % IV BOLUS
500.0000 mL | Freq: Once | INTRAVENOUS | Status: AC
  Administered 2019-02-16: 13:00:00 500 mL via INTRAVENOUS

## 2019-02-16 MED ORDER — MOMETASONE FURO-FORMOTEROL FUM 200-5 MCG/ACT IN AERO
INHALATION_SPRAY | RESPIRATORY_TRACT | Status: AC
  Filled 2019-02-16: qty 8.8

## 2019-02-16 MED ORDER — BISACODYL 10 MG RE SUPP: 10.0000 mg | Freq: Once | RECTAL | Status: DC

## 2019-02-16 MED ORDER — ALBUTEROL SULFATE (2.5 MG/3ML) 0.083% IN NEBU: 2.5000 mg | INHALATION_SOLUTION | RESPIRATORY_TRACT | Status: DC | PRN

## 2019-02-16 MED ORDER — HEPARIN SODIUM (PORCINE) 5000 UNIT/ML IJ SOLN
5000.0000 [IU] | Freq: Three times a day (TID) | INTRAMUSCULAR | Status: DC
  Administered 2019-02-16 – 2019-02-19 (×9): 5000 [IU] via SUBCUTANEOUS
  Filled 2019-02-16 (×9): qty 1

## 2019-02-16 MED ORDER — SODIUM CHLORIDE 0.9 % IV SOLN
2.0000 g | Freq: Once | INTRAVENOUS | Status: AC
  Administered 2019-02-16: 13:00:00 2 g via INTRAVENOUS
  Filled 2019-02-16: qty 2

## 2019-02-16 MED ORDER — OXYCODONE HCL 20 MG PO TABS: 20.0000 mg | ORAL_TABLET | Freq: Four times a day (QID) | ORAL | Status: DC

## 2019-02-16 MED ORDER — SODIUM CHLORIDE 0.9 % IV SOLN
INTRAVENOUS | Status: DC
  Administered 2019-02-16 – 2019-02-17 (×3): via INTRAVENOUS

## 2019-02-16 NOTE — ED Provider Notes (Signed)
[facility] EMERGENCY DEPARTMENT Provider Note   CSN: [id number] Arrival date & time: 02/16/19  1024     History   Chief Complaint Chief Complaint  Patient presents with  . Fever  . Nausea    HPI Samantha Carey is a 73 y.o. female.     Patient brought in by EMS.  Patient lives with her husband.  Patient felt fine until last night when she started nausea vomiting and she developed a fever.  Temperature upon arrival was 101.  Did not complain of any pain in the abdomen chest or any shortness of breath or any congestion or cough.  No one sick at home.  Patient's vital signs met sepsis criteria on arrival.  Patient ill-appearing but mentating fine.  Patient not hypotensive.  Was tachycardic.     Past Medical History:  Diagnosis Date  . Anxiety   . COPD (chronic obstructive pulmonary disease) ([place])   . Hypercholesteremia   . Hypertension   . Migraine     Patient Active Problem List   Diagnosis Date Noted  . Status post trigger finger release 10/28/2016  . Trigger ring finger of right hand     Past Surgical History:  Procedure Laterality Date  . BACK SURGERY     x2  . TOTAL ABDOMINAL HYSTERECTOMY    . TRIGGER FINGER RELEASE Right 10/24/2016   Procedure: RIGHT RING FINGER TRIGGER RELEASE;  Surgeon: Carole Civil, MD;  Location: [facility];  Service: Orthopedics;  Laterality: Right;     OB History    Gravida  3   Para  3   Term  3   Preterm      AB      Living        SAB      TAB      Ectopic      Multiple      Live Births               Home Medications    Prior to Admission medications   Medication Sig Start Date End Date Taking? Authorizing Provider  ALPRAZolam Duanne Moron) 1 MG tablet Take 1 mg by mouth 4 (four) times daily.  09/29/14   [provider]  atorvastatin (LIPITOR) 80 MG tablet Take 80 mg by mouth daily.  04/23/16   [provider]  citalopram (CELEXA) 40 MG tablet Take 40 mg by mouth daily. 06/09/18   [provider]  gabapentin (NEURONTIN) 800 MG tablet Take 800 mg by mouth 4 (four) times daily.  03/01/16   [provider]  ibuprofen (ADVIL,MOTRIN) 800 MG tablet Take 1 tablet (800 mg total) by mouth every 8 (eight) hours as needed. 10/24/16   Carole Civil, MD  Oxycodone HCl 20 MG TABS Take 20 mg by mouth 4 (four) times daily as needed (pain).  09/29/16   [provider]  SYMBICORT 160-4.5 MCG/ACT inhaler Inhale 2 puffs into the lungs daily.  09/13/16   [provider]    Family History Family History  Problem Relation Age of Onset  . Stroke Mother   . Diabetes Mother   . Liver disease Father        alocholic liver cirrhosis  . Liver cancer Father   . Cancer Father   . Stroke Maternal Grandmother   . Diabetes Son     Social History Social History   Tobacco Use  . Smoking status: Former Smoker    Years: 20.00    Types:  Cigarettes    Quit date: 09/21/2016    Years since quitting: 2.4  . Smokeless tobacco: Never Used  Substance Use Topics  . Alcohol use: No    Alcohol/week: 0.0 standard drinks  . Drug use: No     Allergies   Morphine and related   Review of Systems Review of Systems  Constitutional: Positive for fever. Negative for chills.  HENT: Negative for congestion, rhinorrhea and sore throat.   Eyes: Negative for visual disturbance.  Respiratory: Negative for cough and shortness of breath.   Cardiovascular: Negative for chest pain and leg swelling.  Gastrointestinal: Positive for nausea and vomiting. Negative for abdominal pain and diarrhea.  Genitourinary: Negative for dysuria.  Musculoskeletal: Negative for back pain and neck pain.  Skin: Negative for rash.  Neurological: Negative for dizziness, light-headedness and headaches.  Hematological: Does not bruise/bleed easily.  Psychiatric/Behavioral: Negative for confusion.     Physical Exam Updated Vital Signs BP 128/77   Pulse 100   Temp (!) 101 F (38.3 C) (Oral)    Resp 20   Ht (!) 0.127 m (5")   Wt 74.8 kg   SpO2 93%   BMI 4640.31 kg/m   Physical Exam Vitals signs and nursing note reviewed.  Constitutional:      General: She is not in acute distress.    Appearance: She is well-developed. She is ill-appearing.  HENT:     Head: Normocephalic and atraumatic.     Mouth/Throat:     Mouth: Mucous membranes are dry.  Eyes:     Extraocular Movements: Extraocular movements intact.     Conjunctiva/sclera: Conjunctivae normal.     Pupils: Pupils are equal, round, and reactive to light.  Neck:     Musculoskeletal: Normal range of motion and neck supple. No neck rigidity.  Cardiovascular:     Rate and Rhythm: Regular rhythm. Tachycardia present.     Heart sounds: No murmur.  Pulmonary:     Effort: Pulmonary effort is normal. No respiratory distress.     Breath sounds: Normal breath sounds. No wheezing or rales.  Abdominal:     General: Bowel sounds are normal.     Palpations: Abdomen is soft.     Tenderness: There is no abdominal tenderness.  Musculoskeletal: Normal range of motion.        General: No swelling.  Skin:    General: Skin is warm and dry.     Findings: No rash.  Neurological:     General: No focal deficit present.     Mental Status: She is alert.     Cranial Nerves: No cranial nerve deficit.     Sensory: No sensory deficit.     Motor: No weakness.      ED Treatments / Results  Labs (all labs ordered are listed, but only abnormal results are displayed) Labs Reviewed  COMPREHENSIVE METABOLIC PANEL - Abnormal; Notable for the following components:      Result Value   Glucose, Bld 124 (*)    All other components within normal limits  CBC WITH DIFFERENTIAL/PLATELET - Abnormal; Notable for the following components:   WBC 16.2 (*)    Neutro Abs 13.8 (*)    All other components within normal limits  LACTIC ACID, PLASMA - Abnormal; Notable for the following components:   Lactic Acid, Venous 2.3 (*)    All other components  within normal limits  LACTIC ACID, PLASMA - Abnormal; Notable for the following components:   Lactic Acid, Venous 2.1 (*)  All other components within normal limits  CULTURE, BLOOD (ROUTINE X 2)  CULTURE, BLOOD (ROUTINE X 2)  SARS CORONAVIRUS 2 (HOSPITAL ORDER, [facility])  URINE CULTURE  LIPASE, BLOOD  URINALYSIS, ROUTINE W REFLEX MICROSCOPIC    EKG EKG Interpretation  Date/Time:  Tuesday February 16 2019 12:41:19 EDT Ventricular Rate:  112 PR Interval:    QRS Duration: 78 QT Interval:  310 QTC Calculation: 424 R Axis:   37 Text Interpretation:  Sinus tachycardia Consider right atrial enlargement Low voltage, precordial leads Probable anteroseptal infarct, old Confirmed by Fredia Sorrow [phone]) on 02/16/2019 12:44:13 PM   Radiology Dg Chest Port 1 View  Result Date: 02/16/2019 CLINICAL DATA:  Fever and weakness today. EXAM: PORTABLE CHEST 1 VIEW COMPARISON:  Single-view of the chest 09/10/2016. PA and lateral chest 05/15/2016. FINDINGS: The lungs are clear. Heart size is normal. No pneumothorax or pleural fluid. No acute or focal bony abnormality. IMPRESSION: No acute disease. Electronically Signed   By: Inge Rise M.D.   On: 02/16/2019 12:04    Procedures Procedures (including critical care time) CRITICAL CARE Performed by: Fredia Sorrow Total critical care time: 30 minutes Critical care time was exclusive of separately billable procedures and treating other patients. Critical care was necessary to treat or prevent imminent or life-threatening deterioration. Critical care was time spent personally by me on the following activities: development of treatment plan with patient and/or surrogate as well as nursing, discussions with consultants, evaluation of patient's response to treatment, examination of patient, obtaining history from patient or surrogate, ordering and performing treatments and interventions, ordering and review of laboratory  studies, ordering and review of radiographic studies, pulse oximetry and re-evaluation of patient's condition.  Medications Ordered in ED Medications  0.9 %  sodium chloride infusion ( Intravenous New Bag/Given 02/16/19 1328)  vancomycin (VANCOCIN) 1,500 mg in sodium chloride 0.9 % 500 mL IVPB (has no administration in time range)  ceFEPIme (MAXIPIME) 2 g in sodium chloride 0.9 % 100 mL IVPB (2 g Intravenous New Bag/Given 02/16/19 1313)  metroNIDAZOLE (FLAGYL) IVPB 500 mg (500 mg Intravenous New Bag/Given 02/16/19 1328)  sodium chloride 0.9 % bolus 500 mL (0 mLs Intravenous Stopped 02/16/19 1325)  acetaminophen (TYLENOL) tablet 1,000 mg (1,000 mg Oral Given 02/16/19 1322)     Initial Impression / Assessment and Plan / ED Course  I have reviewed the triage vital signs and the nursing notes.  Pertinent labs & imaging results that were available during my care of the patient were reviewed by me and considered in my medical decision making (see chart for details).        Patient presenting with criteria consistent with sepsis.  Never hypotensive.  Lactic acid was always less than 4 so patient was not treated with the full 30 cc/kg fluid challenge.  But she was started on broad-spectrum antibiotic.  Her temperature was 101 heart rate was 108 respiratory rate was 22 and oxygen sats were 92 on presentation.  Patient also with significant leukocytosis white blood cell count 16,000.  Blood cultures pending  Work-up without any significant findings so CT abdomen was done.  Although no specific abdominal pain complaints.  Chest x-ray without pneumonia COVID-19 testing was negative but still not completely ruled out.  Urinalysis also not the source of infection.  Patient does chills lactic acid was 2.3 and then repeat was 2.1.  Discussed with hospitalist they will admit.  CT scan of abdomen pending.  Final Clinical Impressions(s) / ED  Diagnoses   Final diagnoses:  Sepsis, due to unspecified organism,  unspecified whether acute organ dysfunction present [facility])    ED Discharge Orders    None       Fredia Sorrow, MD 02/16/19 1547

## 2019-02-16 NOTE — Progress Notes (Signed)
Pharmacy Antibiotic Note  Samantha Carey is a 73 y.o. female admitted on 02/16/2019 with unknown source of infection.  Pharmacy has been consulted for Vancomycin and cefepime dosing.  Plan: Vancomycin 1500mg  IV loading dose, then 750mg  IV every 12 hours.  Goal trough 15-20 mcg/mL.  Cefepime 2gm IV q12h F/U cxs and clinical progress Monitor V/S, labs and levels as indicated  Height: 5' (152.4 cm) Weight: 165 lb (74.8 kg) IBW/kg (Calculated) : 45.5  Temp (24hrs), Avg:99.2 F (37.3 C), Min:97.4 F (36.3 C), Max:101 F (38.3 C)  Recent Labs  Lab 02/16/19 1212 02/16/19 1314  WBC 16.2*  --   CREATININE 0.84  --   LATICACIDVEN 2.3* 2.1*    Estimated Creatinine Clearance: 54.7 mL/min (by C-G formula based on SCr of 0.84 mg/dL).    Allergies  Allergen Reactions  . Morphine And Related Nausea And Vomiting    Antimicrobials this admission: Vancomycin 6/16 >>  Cefepime 6/16 >>   Dose adjustments this admission: n/a  Microbiology results: 6/16 BCx: pending 6/16 SARS-2 CV is negative  MRSA PCR:   Thank you for allowing pharmacy to be a part of this patient's care.  Isac Sarna, BS [place], [place] Clinical Pharmacist Pager [phone] 02/16/2019 4:54 PM

## 2019-02-16 NOTE — H&P (Signed)
Patient Demographics:    Samantha Carey, is a 73 y.o. female  MRN: [id number]   DOB - 05/26/1946  Admit Date - 02/16/2019  Outpatient Primary MD for the patient is Jenean Lindauoletta, Fenton MallingHarry M, MD   Assessment & Plan:    Principal Problem:   Fever Active Problems:   Back pain/H/o Back Surgery x 2   HTN (hypertension)   COPD (chronic obstructive pulmonary disease) (HCC)    1)Fever and leukocytosis --- WBC 16.2 query if leukocytosis is reactive in the setting of persistent vomiting,  ??  Viral syndrome, UA not suggestive of UTI, chest x-ray without acute findings, CT abdomen and pelvis without acute infectious findings--patient received Vanco, cefepime and Flagyl in the ED, hold off on further antibiotics, blood and urine cultures are pending.. No meningismus on exam... Overall neuro exam is actually reassuring.   2)Nausea/Vomiting--- lipase is not elevated, limits, may be related to viral syndrome as above #1, CT abdomen and pelvis without acute findings, may also be related to high-dose opiates use...... hydrate and advance oral intake as tolerated  3)COPD--stable without acute flareup, continue bronchodilators  4) lactic acidosis--- suspect due to poor perfusion in the setting of dehydration/volume depletion secondary to nausea and vomiting with GI losses and poor oral intake--- however given fevers need to rule out infectious etiology, blood and urine cultures are pending  5)HTN--- hold Maxide due to concerns about dehydration risk in the setting of nausea and vomiting and lactic acidosis, may use IV hydralazine as needed elevated BP  6) constipation and urinary retention----CT finding of distended urinary bladder noted----patient with high-dose gabapentin and opiate use... Suspect anticholinergic effects of these agents is  responsible----Senokot and MiraLAX as prescribed, consider in and out Foley if unable to void  7) chronic back pain--- prior back surgery x2, prior to admission patient was on oxycodone 20 mg 4 times a day and she took it religiously, along with gabapentin 800 mg 4 times a day----avoid abrupt discontinuation of her opiates and benzos given the patient has been on this for a while... Oxycodone is been changed to 20 mg 3 times daily as needed , gabapentin changed to 600 mg 3 times daily  8) depression and anxiety --given excessive sleepiness Xanax has been changed to 0.5 up to 4 times a day as needed as well, continue Wellbutrin 300 mg daily and trazodone 100 mg nightly  With History of - Reviewed by me  Past Medical History:  Diagnosis Date  . Anxiety   . COPD (chronic obstructive pulmonary disease) (HCC)   . Hypercholesteremia   . Hypertension   . Migraine       Past Surgical History:  Procedure Laterality Date  . BACK SURGERY     x2  . TOTAL ABDOMINAL HYSTERECTOMY    . TRIGGER FINGER RELEASE Right 10/24/2016   Procedure: RIGHT RING FINGER TRIGGER RELEASE;  Surgeon: Vickki HearingStanley E Harrison, MD;  Location: [facility];  Service: Orthopedics;  Laterality: Right;      Chief Complaint  Patient presents with  . Fever  . Nausea      HPI:    Samantha Carey  is a 73 y.o. female with past medical history relevant for COPD, chronic pain syndrome, HTN and HLD presents by EMS with nausea and vomiting and fevers since 02/15/2019 p.m.... No significant abdominal pain, no urinary symptoms, no diarrhea, no sick contacts, no productive cough no chest pains  patient wakes up , answers questions appropriately, then goes back to sleep No headaches, no neck pain, no neck stiffness, no visual disturbance, emesis was without bile or blood--  I Called and obtained additional information from patient's husband Mr. Jessee AversHank Brame at [phone]  In ED temp up to 101, WBCs 16 and lactic acid is 2.3, COVID-19 test  negative -Lipase was not elevated UA not suggestive of UTI, chest x-ray without acute findings, CT abdomen and pelvis without acute infectious findings--patient received Vanco, cefepime and Flagyl in the ED, EDP gave fluids for elevated lactic acid    Review of systems:    In addition to the HPI above,   A full Review of  Systems was done, all other systems reviewed are negative except as noted above in HPI , .    Social History:  Reviewed by me    Social History   Tobacco Use  . Smoking status: Former Smoker    Years: 20.00    Types: Cigarettes    Quit date: 09/21/2016    Years since quitting: 2.4  . Smokeless tobacco: Never Used  Substance Use Topics  . Alcohol use: No    Alcohol/week: 0.0 standard drinks       Family History :  Reviewed by me    Family History  Problem Relation Age of Onset  . Stroke Mother   . Diabetes Mother   . Liver disease Father        alocholic liver cirrhosis  . Liver cancer Father   . Cancer Father   . Stroke Maternal Grandmother   . Diabetes Son      Home Medications:   Prior to Admission medications   Medication Sig Start Date End Date Taking? Authorizing Provider  ALPRAZolam Prudy Feeler(XANAX) 0.5 MG tablet Take 0.5 mg by mouth 4 (four) times daily.  02/12/19  Yes [provider]  atorvastatin (LIPITOR) 80 MG tablet Take 80 mg by mouth daily.  04/23/16  Yes [provider]  buPROPion (WELLBUTRIN XL) 300 MG 24 hr tablet Take 300 mg by mouth daily.  02/02/19  Yes [provider]  gabapentin (NEURONTIN) 800 MG tablet Take 800 mg by mouth 4 (four) times daily.  03/01/16  Yes [provider]  ibuprofen (ADVIL,MOTRIN) 800 MG tablet Take 1 tablet (800 mg total) by mouth every 8 (eight) hours as needed. 10/24/16  Yes Vickki HearingHarrison, Stanley E, MD  Oxycodone HCl 20 MG TABS Take 20 mg by mouth 4 (four) times daily.  09/29/16  Yes [provider]  SYMBICORT 160-4.5 MCG/ACT inhaler Inhale 2 puffs into the lungs daily as  needed (for shortness of breath).  09/13/16  Yes [provider]  traZODone (DESYREL) 150 MG tablet Take 150 mg by mouth at bedtime as needed for sleep.  11/30/18  Yes [provider]  triamterene-hydrochlorothiazide (MAXZIDE) 75-50 MG tablet Take 1 tablet by mouth daily.  12/01/18  Yes [provider]     Allergies:     Allergies  Allergen Reactions  . Morphine  And Related Nausea And Vomiting     Physical Exam:   Vitals  Blood pressure 131/83, pulse 88, temperature 98.3 F (36.8 C), temperature source Oral, resp. rate 18, height 5\' 6"  (1.676 m), weight 81.8 kg, SpO2 96 %.  Physical Examination: General appearance -patient wakes up , answers questions appropriately, then goes back to sleep, non-toxic appearing, and in no distress  Mental status - patient wakes up , answers questions appropriately, then goes back to sleep, oriented to person, place, and time,  Eyes - sclera anicteric Mouth--- dry oral mucosa Neck - supple, no JVD elevation , Chest - clear  to auscultation bilaterally, symmetrical air movement,  Heart - S1 and S2 normal, regular  Abdomen - soft, nontender, nondistended, no masses or organomegaly Neurological - screening mental status exam normal, neck supple without rigidity, cranial nerves II through XII intact, DTR's normal and symmetric... , painless range of motion in neck Extremities - no pedal edema noted, intact peripheral pulses  Skin - warm, dry     Data Review:    CBC Recent Labs  Lab 02/16/19 1212  WBC 16.2*  HGB 13.9  HCT 42.8  PLT 241  MCV 88.4  MCH 28.7  MCHC 32.5  RDW 13.2  LYMPHSABS 1.4  MONOABS 0.7  EOSABS 0.2  BASOSABS 0.0   ------------------------------------------------------------------------------------------------------------------  Chemistries  Recent Labs  Lab 02/16/19 1212  NA 139  K 4.0  CL 105  CO2 22  GLUCOSE 124*  BUN 12  CREATININE 0.84  CALCIUM 9.3  AST 23  ALT 22  ALKPHOS 125   BILITOT 0.8   ------------------------------------------------------------------------------------------------------------------ estimated creatinine clearance is 65.3 mL/min (by C-G formula based on SCr of 0.84 mg/dL). ------------------------------------------------------------------------------------------------------------------ No results for input(s): TSH, T4TOTAL, T3FREE, THYROIDAB in the last 72 hours.  Invalid input(s): FREET3   Coagulation profile No results for input(s): INR, PROTIME in the last 168 hours. ------------------------------------------------------------------------------------------------------------------- No results for input(s): DDIMER in the last 72 hours. -------------------------------------------------------------------------------------------------------------------  Cardiac Enzymes No results for input(s): CKMB, TROPONINI, MYOGLOBIN in the last 168 hours.  Invalid input(s): CK ------------------------------------------------------------------------------------------------------------------ No results found for: BNP   ---------------------------------------------------------------------------------------------------------------  Urinalysis    Component Value Date/Time   COLORURINE YELLOW 02/16/2019 [place] 02/16/2019 1146   LABSPEC 1.009 02/16/2019 1146   PHURINE 6.0 02/16/2019 1146   GLUCOSEU NEGATIVE 02/16/2019 1146   HGBUR NEGATIVE 02/16/2019 [place] 02/16/2019 1146   KETONESUR NEGATIVE 02/16/2019 1146   PROTEINUR NEGATIVE 02/16/2019 1146   NITRITE NEGATIVE 02/16/2019 1146   LEUKOCYTESUR NEGATIVE 02/16/2019 1146   ----------------------------------------------------------------------------------------------------------------   Imaging Results:    Ct Abdomen Pelvis W Contrast  Result Date: 02/16/2019 CLINICAL DATA:  Abdominal pain and nausea and vomiting beginning yesterday. Fever. EXAM: CT  ABDOMEN AND PELVIS WITH CONTRAST TECHNIQUE: Multidetector CT imaging of the abdomen and pelvis was performed using the standard protocol following bolus administration of intravenous contrast. CONTRAST:  153mL OMNIPAQUE IOHEXOL 300 MG/ML  SOLN COMPARISON:  None. FINDINGS: Lower Chest: No acute findings. Hepatobiliary: No hepatic masses identified. Gallbladder is unremarkable. Pancreas:  No mass or inflammatory changes. Spleen: Within normal limits in size and appearance. Adrenals/Urinary Tract: No masses identified. No evidence of ureteral calculi or hydronephrosis. The urinary bladder is markedly distended but otherwise unremarkable in appearance. Stomach/Bowel: No evidence of obstruction, inflammatory process or abnormal fluid collections. Normal appendix visualized. Vascular/Lymphatic: No pathologically enlarged lymph nodes. No abdominal aortic aneurysm. Aortic atherosclerosis. Reproductive: Prior hysterectomy noted. Adnexal regions are unremarkable in appearance. Other:  None. Musculoskeletal:  No suspicious bone lesions identified. IMPRESSION: Markedly distended urinary bladder. Recommend clinical correlation for urinary retention. No other acute findings. Aortic Atherosclerosis (ICD10-I70.0). Electronically Signed   By: Myles RosenthalJohn  Stahl M.D.   On: 02/16/2019 16:38   Dg Chest Port 1 View  Result Date: 02/16/2019 CLINICAL DATA:  Fever and weakness today. EXAM: PORTABLE CHEST 1 VIEW COMPARISON:  Single-view of the chest 09/10/2016. PA and lateral chest 05/15/2016. FINDINGS: The lungs are clear. Heart size is normal. No pneumothorax or pleural fluid. No acute or focal bony abnormality. IMPRESSION: No acute disease. Electronically Signed   By: Drusilla Kannerhomas  Dalessio M.D.   On: 02/16/2019 12:04    Radiological Exams on Admission: Ct Abdomen Pelvis W Contrast  Result Date: 02/16/2019 CLINICAL DATA:  Abdominal pain and nausea and vomiting beginning yesterday. Fever. EXAM: CT ABDOMEN AND PELVIS WITH CONTRAST TECHNIQUE:  Multidetector CT imaging of the abdomen and pelvis was performed using the standard protocol following bolus administration of intravenous contrast. CONTRAST:  100mL OMNIPAQUE IOHEXOL 300 MG/ML  SOLN COMPARISON:  None. FINDINGS: Lower Chest: No acute findings. Hepatobiliary: No hepatic masses identified. Gallbladder is unremarkable. Pancreas:  No mass or inflammatory changes. Spleen: Within normal limits in size and appearance. Adrenals/Urinary Tract: No masses identified. No evidence of ureteral calculi or hydronephrosis. The urinary bladder is markedly distended but otherwise unremarkable in appearance. Stomach/Bowel: No evidence of obstruction, inflammatory process or abnormal fluid collections. Normal appendix visualized. Vascular/Lymphatic: No pathologically enlarged lymph nodes. No abdominal aortic aneurysm. Aortic atherosclerosis. Reproductive: Prior hysterectomy noted. Adnexal regions are unremarkable in appearance. Other:  None. Musculoskeletal:  No suspicious bone lesions identified. IMPRESSION: Markedly distended urinary bladder. Recommend clinical correlation for urinary retention. No other acute findings. Aortic Atherosclerosis (ICD10-I70.0). Electronically Signed   By: Myles RosenthalJohn  Stahl M.D.   On: 02/16/2019 16:38   Dg Chest Port 1 View  Result Date: 02/16/2019 CLINICAL DATA:  Fever and weakness today. EXAM: PORTABLE CHEST 1 VIEW COMPARISON:  Single-view of the chest 09/10/2016. PA and lateral chest 05/15/2016. FINDINGS: The lungs are clear. Heart size is normal. No pneumothorax or pleural fluid. No acute or focal bony abnormality. IMPRESSION: No acute disease. Electronically Signed   By: Drusilla Kannerhomas  Dalessio M.D.   On: 02/16/2019 12:04    DVT Prophylaxis -SCD   AM Labs Ordered, also please review Full Orders  Family Communication: Admission, patients condition and plan of care including tests being ordered have been discussed with the patient and husband (Hank) who indicate understanding and agree  with the plan   Code Status - Full Code  Likely DC to  home  Condition  -stable  Shon Haleourage Teasia Zapf M.D on 02/16/2019 at 6:47 PM Go to [email or web address] -  for contact info  [facility] - Office  [phone]

## 2019-02-16 NOTE — ED Notes (Signed)
Provider notified of Lactic 2.1

## 2019-02-16 NOTE — ED Triage Notes (Signed)
Pt arrived via EMS, c/o N/V that started last night. Fever upon arrival 101.0. No complaints of pain or shortness of breath.

## 2019-02-16 NOTE — ED Notes (Signed)
CRITICAL VALUE ALERT  Critical Value:  Lactic 2.3  Date & Time Notied:  02/16/2019 1308  Provider Notified: Dr. Rogene Houston   Orders Received/Actions taken: Code Sepsis

## 2019-02-17 DIAGNOSIS — K2970 Gastritis, unspecified, without bleeding: Secondary | ICD-10-CM | POA: Diagnosis present

## 2019-02-17 DIAGNOSIS — Y92230 Patient room in hospital as the place of occurrence of the external cause: Secondary | ICD-10-CM | POA: Diagnosis not present

## 2019-02-17 DIAGNOSIS — E876 Hypokalemia: Secondary | ICD-10-CM | POA: Diagnosis not present

## 2019-02-17 DIAGNOSIS — R7881 Bacteremia: Secondary | ICD-10-CM | POA: Diagnosis not present

## 2019-02-17 DIAGNOSIS — Z5329 Procedure and treatment not carried out because of patient's decision for other reasons: Secondary | ICD-10-CM | POA: Diagnosis not present

## 2019-02-17 DIAGNOSIS — G894 Chronic pain syndrome: Secondary | ICD-10-CM | POA: Diagnosis present

## 2019-02-17 DIAGNOSIS — R509 Fever, unspecified: Secondary | ICD-10-CM | POA: Diagnosis present

## 2019-02-17 DIAGNOSIS — G9341 Metabolic encephalopathy: Secondary | ICD-10-CM | POA: Diagnosis not present

## 2019-02-17 DIAGNOSIS — Z20828 Contact with and (suspected) exposure to other viral communicable diseases: Secondary | ICD-10-CM | POA: Diagnosis present

## 2019-02-17 DIAGNOSIS — Z87891 Personal history of nicotine dependence: Secondary | ICD-10-CM | POA: Diagnosis not present

## 2019-02-17 DIAGNOSIS — M549 Dorsalgia, unspecified: Secondary | ICD-10-CM | POA: Diagnosis present

## 2019-02-17 DIAGNOSIS — Z79891 Long term (current) use of opiate analgesic: Secondary | ICD-10-CM | POA: Diagnosis not present

## 2019-02-17 DIAGNOSIS — K5900 Constipation, unspecified: Secondary | ICD-10-CM | POA: Diagnosis present

## 2019-02-17 DIAGNOSIS — I10 Essential (primary) hypertension: Secondary | ICD-10-CM | POA: Diagnosis present

## 2019-02-17 DIAGNOSIS — Z7951 Long term (current) use of inhaled steroids: Secondary | ICD-10-CM | POA: Diagnosis not present

## 2019-02-17 DIAGNOSIS — N19 Unspecified kidney failure: Secondary | ICD-10-CM | POA: Diagnosis present

## 2019-02-17 DIAGNOSIS — J449 Chronic obstructive pulmonary disease, unspecified: Secondary | ICD-10-CM | POA: Diagnosis present

## 2019-02-17 DIAGNOSIS — Z79899 Other long term (current) drug therapy: Secondary | ICD-10-CM | POA: Diagnosis not present

## 2019-02-17 DIAGNOSIS — R339 Retention of urine, unspecified: Secondary | ICD-10-CM | POA: Diagnosis present

## 2019-02-17 DIAGNOSIS — T50915A Adverse effect of multiple unspecified drugs, medicaments and biological substances, initial encounter: Secondary | ICD-10-CM | POA: Diagnosis not present

## 2019-02-17 DIAGNOSIS — E785 Hyperlipidemia, unspecified: Secondary | ICD-10-CM | POA: Diagnosis present

## 2019-02-17 DIAGNOSIS — G92 Toxic encephalopathy: Secondary | ICD-10-CM | POA: Diagnosis not present

## 2019-02-17 DIAGNOSIS — F329 Major depressive disorder, single episode, unspecified: Secondary | ICD-10-CM | POA: Diagnosis present

## 2019-02-17 DIAGNOSIS — A419 Sepsis, unspecified organism: Secondary | ICD-10-CM

## 2019-02-17 DIAGNOSIS — F419 Anxiety disorder, unspecified: Secondary | ICD-10-CM | POA: Diagnosis present

## 2019-02-17 DIAGNOSIS — R21 Rash and other nonspecific skin eruption: Secondary | ICD-10-CM | POA: Diagnosis present

## 2019-02-17 DIAGNOSIS — E860 Dehydration: Secondary | ICD-10-CM | POA: Diagnosis present

## 2019-02-17 LAB — BASIC METABOLIC PANEL
Anion gap: 8 (ref 5–15)
BUN: 9 mg/dL (ref 8–23)
CO2: 22 mmol/L (ref 22–32)
Calcium: 8.2 mg/dL — ABNORMAL LOW (ref 8.9–10.3)
Chloride: 110 mmol/L (ref 98–111)
Creatinine, Ser: 0.65 mg/dL (ref 0.44–1.00)
GFR calc Af Amer: >60 mL/min (ref >60)
GFR calc non Af Amer: >60 mL/min (ref >60)
Glucose, Bld: 109 mg/dL — ABNORMAL HIGH (ref 70–99)
Potassium: 3.2 mmol/L — ABNORMAL LOW (ref 3.5–5.1)
Sodium: 140 mmol/L (ref 135–145)

## 2019-02-17 LAB — BLOOD CULTURE ID PANEL (REFLEXED)

## 2019-02-17 LAB — CBC
HCT: 39.9 % (ref 36.0–46.0)
Hemoglobin: 12.8 g/dL (ref 12.0–15.0)
MCH: 29 pg (ref 26.0–34.0)
MCHC: 32.1 g/dL (ref 30.0–36.0)
MCV: 90.5 fL (ref 80.0–100.0)
Platelets: 254 10*3/uL (ref 150–400)
RBC: 4.41 MIL/uL (ref 3.87–5.11)
RDW: 13.5 % (ref 11.5–15.5)
WBC: 8.8 10*3/uL (ref 4.0–10.5)
nRBC: 0 % (ref 0.0–0.2)

## 2019-02-17 LAB — RAPID URINE DRUG SCREEN, HOSP PERFORMED
Amphetamines: NOT DETECTED
Barbiturates: NOT DETECTED
Benzodiazepines: POSITIVE — AB
Cocaine: NOT DETECTED
Opiates: POSITIVE — AB
Tetrahydrocannabinol: NOT DETECTED

## 2019-02-17 LAB — URINE CULTURE

## 2019-02-17 LAB — SARS CORONAVIRUS 2 BY RT PCR (HOSPITAL ORDER, PERFORMED IN ~~LOC~~ HOSPITAL LAB): SARS Coronavirus 2: NEGATIVE

## 2019-02-17 LAB — PROCALCITONIN: Procalcitonin: 1.22 ng/mL

## 2019-02-17 MED ORDER — VANCOMYCIN HCL 1.5 G IV SOLR
1500.0000 mg | Freq: Once | INTRAVENOUS | Status: AC
  Administered 2019-02-17: 10:00:00 1500 mg via INTRAVENOUS
  Filled 2019-02-17: qty 1500

## 2019-02-17 MED ORDER — POTASSIUM CHLORIDE IN NACL 20-0.9 MEQ/L-% IV SOLN
INTRAVENOUS | Status: DC
  Administered 2019-02-17 – 2019-02-19 (×4): via INTRAVENOUS

## 2019-02-17 MED ORDER — POTASSIUM CHLORIDE CRYS ER 20 MEQ PO TBCR
40.0000 meq | EXTENDED_RELEASE_TABLET | Freq: Once | ORAL | Status: AC
  Administered 2019-02-17: 13:00:00 40 meq via ORAL
  Filled 2019-02-17: qty 2

## 2019-02-17 MED ORDER — VANCOMYCIN HCL IN DEXTROSE 1-5 GM/200ML-% IV SOLN
1000.0000 mg | Freq: Two times a day (BID) | INTRAVENOUS | Status: DC
  Administered 2019-02-17 – 2019-02-19 (×4): 1000 mg via INTRAVENOUS
  Filled 2019-02-17 (×4): qty 200

## 2019-02-17 NOTE — Progress Notes (Addendum)
PROGRESS NOTE  Samantha Carey ZOX:[id number] DOB: 06/02/1946 DOA: 02/16/2019 PCP: Raynelle Janoletta, Harry M, MD  Brief History:  73 year old female with a history of COPD, chronic pain syndrome, hypertension, hyperlipidemia presenting with 1 day history of fevers and associated nausea and vomiting.  She is a difficult historian, but ultimately is able to provide her clinical history.  She denies any recent travels or sick contacts.  She denies any recent headache, neck pain, chest pain, shortness breath, coughing, hemoptysis, diarrhea, abdominal pain, dysuria, hematuria.  She denies any arthralgias or anosmia.  Upon presentation, the patient was noted to have temperature of 101.0 F with WBC 16.2 and lactic acid 2.3.  She was started on vancomycin and cefepime, and metronidazole.  She was started on IV fluids.  CT of the abdomen and pelvis on 02/16/2019 was negative for any acute findings except for distended urinary bladder.  Assessment/Plan: Sepsis -present on admission -presented with fever, leukocytosis and elevated lactic acid -possibly due to bacteremia -COVID NAA--neg -lactic acid peaked 2.3 -follow blood culture -UA--neg for pyuria -personally reviewed CXR--no infiltrates -continue vancomycin -check procalcitonin  Bacteremia -GPC in one of 2 sets -continue vancomycin pending final culture data  Malar Exanthem -viral exanthem vs red man syndrome (pt currently has vanco infusing) -viral respiratory panel -repeat COVID  Chronic Pain Syndrome -PMP AWARE--queried--no red flags -continue home dose oxycodone and alprazolam -continue gabapentin  Nausea and Vomiting -likely due to gastritis -resolved -lipase 21 -02/16/19--CT abd--neg for acute findings  Depression and Anxiety -continue buproprion, alprazolam, trazodone  Hyperlipidemia -continue statin  COPD -stable on RA -continue Dulera  Essential Hypertension -holding Maxzide  Hypokalemia -replete -check  mag      Disposition Plan:   Home in 1-2 days  Family Communication:   Family at bedside  Consultants:  none  Code Status:  FULL   DVT Prophylaxis:  [place] Heparin / [place] Lovenox   Procedures: As Listed in Progress Note Above  Antibiotics: None    PPE--boufant cap, goggles, N95 mask, gown   Subjective: Patient denies fevers, chills, headache, chest pain, dyspnea, nausea, vomiting, diarrhea, abdominal pain, dysuria, hematuria, hematochezia, and melena.   Objective: Vitals:   02/16/19 2126 02/17/19 0519 02/17/19 0633 02/17/19 0901  BP: (!) 147/88 (!) 130/59    Pulse: (!) 106 (!) 115    Resp: 18 16    Temp: 98.5 F (36.9 C) (!) 102.6 F (39.2 C) 99.3 F (37.4 C) 98.3 F (36.8 C)  TempSrc: Oral Oral Oral   SpO2: 99% 97%  93%  Weight:      Height:        Intake/Output Summary (Last 24 hours) at 02/17/2019 1114 Last data filed at 02/17/2019 [id number] Gross per 24 hour  Intake 2781.58 ml  Output 250 ml  Net 2531.58 ml   Weight change:  Exam:   General:  Pt is alert, follows commands appropriately, not in acute distress  HEENT: No icterus, No thrush, No neck mass, [place]/AT  Cardiovascular: RRR, S1/S2, no rubs, no gallops  Respiratory: Bibasilar rales.  No wheezing.  Good air movement.  Abdomen: Soft/+BS, non tender, non distended, no guarding  Extremities: No edema, No lymphangitis, No petechiae, No rashes, no synovitis   Data Reviewed: I have personally reviewed following labs and imaging studies Basic Metabolic Panel: Recent Labs  Lab 02/16/19 1212 02/17/19 0514  NA 139 140  K 4.0 3.2*  CL 105 110  CO2 22 22  GLUCOSE 124* 109*  BUN 12 9  CREATININE 0.84 0.65  CALCIUM 9.3 8.2*   Liver Function Tests: Recent Labs  Lab 02/16/19 1212  AST 23  ALT 22  ALKPHOS 125  BILITOT 0.8  PROT 6.8  ALBUMIN 3.8   Recent Labs  Lab 02/16/19 1212  LIPASE 21   No results for input(s): AMMONIA in the last 168 hours. Coagulation Profile: No results for  input(s): INR, PROTIME in the last 168 hours. CBC: Recent Labs  Lab 02/16/19 1212 02/17/19 0514  WBC 16.2* 8.8  NEUTROABS 13.8*  --   HGB 13.9 12.8  HCT 42.8 39.9  MCV 88.4 90.5  PLT 241 254   Cardiac Enzymes: No results for input(s): CKTOTAL, CKMB, CKMBINDEX, TROPONINI in the last 168 hours. BNP: Invalid input(s): POCBNP CBG: No results for input(s): GLUCAP in the last 168 hours. HbA1C: No results for input(s): HGBA1C in the last 72 hours. Urine analysis:    Component Value Date/Time   COLORURINE YELLOW 02/16/2019 1146   APPEARANCEUR CLEAR 02/16/2019 1146   LABSPEC 1.009 02/16/2019 1146   PHURINE 6.0 02/16/2019 1146   GLUCOSEU NEGATIVE 02/16/2019 1146   HGBUR NEGATIVE 02/16/2019 1146   BILIRUBINUR NEGATIVE 02/16/2019 1146   KETONESUR NEGATIVE 02/16/2019 1146   PROTEINUR NEGATIVE 02/16/2019 1146   NITRITE NEGATIVE 02/16/2019 1146   LEUKOCYTESUR NEGATIVE 02/16/2019 1146   Sepsis Labs: @LABRCNTIP (procalcitonin:4,lacticidven:4) ) Recent Results (from the past 240 hour(s))  SARS Coronavirus 2 (CEPHEID- Performed in [facility]), Hosp Order     Status: None   Collection Time: 02/16/19 12:26 PM   Specimen: Nasopharyngeal Swab  Result Value Ref Range Status   SARS Coronavirus 2 NEGATIVE NEGATIVE Final    Comment: (NOTE) If result is NEGATIVE SARS-CoV-2 target nucleic acids are NOT DETECTED. The SARS-CoV-2 RNA is generally detectable in upper and lower  respiratory specimens during the acute phase of infection. The lowest  concentration of SARS-CoV-2 viral copies this assay can detect is 250  copies / mL. A negative result does not preclude SARS-CoV-2 infection  and should not be used as the sole basis for treatment or other  patient management decisions.  A negative result may occur with  improper specimen collection / handling, submission of specimen other  than nasopharyngeal swab, presence of viral mutation(s) within the  areas targeted by this assay,  and inadequate number of viral copies  (<250 copies / mL). A negative result must be combined with clinical  observations, patient history, and epidemiological information. If result is POSITIVE SARS-CoV-2 target nucleic acids are DETECTED. The SARS-CoV-2 RNA is generally detectable in upper and lower  respiratory specimens dur ing the acute phase of infection.  Positive  results are indicative of active infection with SARS-CoV-2.  Clinical  correlation with patient history and other diagnostic information is  necessary to determine patient infection status.  Positive results do  not rule out bacterial infection or co-infection with other viruses. If result is PRESUMPTIVE POSTIVE SARS-CoV-2 nucleic acids MAY BE PRESENT.   A presumptive positive result was obtained on the submitted specimen  and confirmed on repeat testing.  While 2019 novel coronavirus  (SARS-CoV-2) nucleic acids may be present in the submitted sample  additional confirmatory testing may be necessary for epidemiological  and / or clinical management purposes  to differentiate between  SARS-CoV-2 and other Sarbecovirus currently known to infect humans.  If clinically indicated additional testing with an alternate test  methodology [phone]([id number]) is advised. The SARS-CoV-2 RNA is generally  detectable in upper  and lower respiratory sp ecimens during the acute  phase of infection. The expected result is Negative. Fact Sheet for Patients:  StrictlyIdeas.no Fact Sheet for Healthcare Providers: BankingDealers.co.za This test is not yet approved or cleared by the [place] FDA and has been authorized for detection and/or diagnosis of SARS-CoV-2 by FDA under an Emergency Use Authorization (EUA).  This EUA will remain in effect (meaning this test can be used) for the duration of the COVID-19 declaration under Section 564(b)(1) of the Act, 21 U.S.C. section 360bbb-3(b)(1), unless  the authorization is terminated or revoked sooner. Performed at [facility], [address] [place], [place] [address]   Blood Culture (routine x 2)     Status: None (Preliminary result)   Collection Time: 02/16/19 12:43 PM   Specimen: BLOOD RIGHT HAND  Result Value Ref Range Status   Specimen Description BLOOD RIGHT HAND  Final   Special Requests   Final    BOTTLES DRAWN AEROBIC AND ANAEROBIC Blood Culture adequate volume   Culture   Final    NO GROWTH < 24 HOURS Performed at [facility], [address] [place], [place] [address]    Report Status PENDING  Incomplete  Blood Culture (routine x 2)     Status: None (Preliminary result)   Collection Time: 02/16/19 12:47 PM   Specimen: BLOOD LEFT ARM  Result Value Ref Range Status   Specimen Description BLOOD LEFT ARM  Final   Special Requests   Final    BOTTLES DRAWN AEROBIC AND ANAEROBIC Blood Culture adequate volume   Culture  Setup Time   Final    GRAM POSITIVE COCCI BOTH AEROBIC AND ANAEROBIC BOTTLES Gram Stain Report Called to,Read Back By and Verified With: BENSON D. AT 0814A ON [id number] BY THOMPSON S. Performed at [facility], [address], [place],  [address]    Culture PENDING  Incomplete   Report Status PENDING  Incomplete     Scheduled Meds: . atorvastatin  80 mg Oral q1800  . bisacodyl  10 mg Rectal Once  . buPROPion  300 mg Oral Daily  . gabapentin  600 mg Oral TID  . heparin  5,000 Units Subcutaneous Q8H  . mometasone-formoterol  2 puff Inhalation BID  . polyethylene glycol  17 g Oral Daily  . senna-docusate  2 tablet Oral BID  . sodium chloride flush  3 mL Intravenous Q12H   Continuous Infusions: . sodium chloride 150 mL/hr at 02/17/19 0841  . sodium chloride    . vancomycin 1,500 mg (02/17/19 1007)   Followed by  . vancomycin      Procedures/Studies: Ct Abdomen Pelvis W Contrast  Result Date: 02/16/2019 CLINICAL DATA:  Abdominal pain and nausea and vomiting beginning yesterday. Fever. EXAM:  CT ABDOMEN AND PELVIS WITH CONTRAST TECHNIQUE: Multidetector CT imaging of the abdomen and pelvis was performed using the standard protocol following bolus administration of intravenous contrast. CONTRAST:  136mL OMNIPAQUE IOHEXOL 300 MG/ML  SOLN COMPARISON:  None. FINDINGS: Lower Chest: No acute findings. Hepatobiliary: No hepatic masses identified. Gallbladder is unremarkable. Pancreas:  No mass or inflammatory changes. Spleen: Within normal limits in size and appearance. Adrenals/Urinary Tract: No masses identified. No evidence of ureteral calculi or hydronephrosis. The urinary bladder is markedly distended but otherwise unremarkable in appearance. Stomach/Bowel: No evidence of obstruction, inflammatory process or abnormal fluid collections. Normal appendix visualized. Vascular/Lymphatic: No pathologically enlarged lymph nodes. No abdominal aortic aneurysm. Aortic atherosclerosis. Reproductive: Prior hysterectomy noted. Adnexal regions are unremarkable in appearance. Other:  None. Musculoskeletal:  No suspicious bone lesions identified. IMPRESSION: Markedly distended urinary bladder. Recommend clinical correlation for urinary retention. No other acute findings. Aortic Atherosclerosis (ICD10-I70.0). Electronically Signed   By: Myles RosenthalJohn  Stahl M.D.   On: 02/16/2019 16:38   Dg Chest Port 1 View  Result Date: 02/16/2019 CLINICAL DATA:  Fever and weakness today. EXAM: PORTABLE CHEST 1 VIEW COMPARISON:  Single-view of the chest 09/10/2016. PA and lateral chest 05/15/2016. FINDINGS: The lungs are clear. Heart size is normal. No pneumothorax or pleural fluid. No acute or focal bony abnormality. IMPRESSION: No acute disease. Electronically Signed   By: Drusilla Kannerhomas  Dalessio M.D.   On: 02/16/2019 12:04    Samantha Hartshornavid Marise Knapper, DO  [facility] Pager [phone]  If 7PM-7AM, please contact night-coverage [email or web address] Password [email or web address] 02/17/2019, 11:14 AM   LOS: 0 days

## 2019-02-17 NOTE — Progress Notes (Signed)
Pharmacy Antibiotic Note  Samantha Carey is a 73 y.o. female admitted on 02/16/2019 with unknown source of infection.  Pharmacy has been consulted for Vancomycin dosing. Restarting Vancomycin, so will reload Plan:  Vancomycin 1500mg  IV loading dose, then 1000mg  IV every 12 hours.  Goal trough 15-20 mcg/mL. F/U cxs and clinical progress Monitor V/S, labs and levels as indicated  Height: 5\' 6"  (167.6 cm) Weight: 180 lb 5.4 oz (81.8 kg) IBW/kg (Calculated) : 59.3  Temp (24hrs), Avg:99.5 F (37.5 C), Min:97.4 F (36.3 C), Max:102.6 F (39.2 C)  Recent Labs  Lab 02/16/19 1212 02/16/19 1314 02/17/19 0514  WBC 16.2*  --  8.8  CREATININE 0.84  --  0.65  LATICACIDVEN 2.3* 2.1*  --     Estimated Creatinine Clearance: 68.5 mL/min (by C-G formula based on SCr of 0.65 mg/dL).    Allergies  Allergen Reactions  . Morphine And Related Nausea And Vomiting    Antimicrobials this admission: Vancomycin 6/16 >> 6/16 restart 6/17>> Cefepime 6/16 >> 6/16  Dose adjustments this admission: n/a  Microbiology results: 6/16 BCx: 1 bottle +GPC 6/16 UCX: pending 6/16 SARS-2 CV is negative  MRSA PCR:   Thank you for allowing pharmacy to be a part of this patient's care.  Isac Sarna, BS Pharm D, [place] Clinical Pharmacist Pager [phone] 02/17/2019 9:17 AM

## 2019-02-18 ENCOUNTER — Inpatient Hospital Stay (HOSPITAL_COMMUNITY): Payer: Medicare HMO

## 2019-02-18 DIAGNOSIS — G9341 Metabolic encephalopathy: Secondary | ICD-10-CM

## 2019-02-18 LAB — BLOOD GAS, ARTERIAL
Acid-base deficit: 3.8 mmol/L — ABNORMAL HIGH (ref 0.0–2.0)
Bicarbonate: 22.1 mmol/L (ref 20.0–28.0)
FIO2: 21
O2 Saturation: 96.5 %
Patient temperature: 36.7
pCO2 arterial: 28.9 mmHg — ABNORMAL LOW (ref 32.0–48.0)
pH, Arterial: 7.445 (ref 7.350–7.450)
pO2, Arterial: 82.5 mmHg — ABNORMAL LOW (ref 83.0–108.0)

## 2019-02-18 LAB — CBC WITH DIFFERENTIAL/PLATELET
Abs Immature Granulocytes: 0.02 10*3/uL (ref 0.00–0.07)
Basophils Absolute: 0 10*3/uL (ref 0.0–0.1)
Basophils Relative: 0 %
Eosinophils Absolute: 0.4 10*3/uL (ref 0.0–0.5)
Eosinophils Relative: 7 %
HCT: 36.5 % (ref 36.0–46.0)
Hemoglobin: 11.3 g/dL — ABNORMAL LOW (ref 12.0–15.0)
Immature Granulocytes: 0 %
Lymphocytes Relative: 19 %
Lymphs Abs: 1.1 10*3/uL (ref 0.7–4.0)
MCH: 28.5 pg (ref 26.0–34.0)
MCHC: 31 g/dL (ref 30.0–36.0)
MCV: 91.9 fL (ref 80.0–100.0)
Monocytes Absolute: 0.3 10*3/uL (ref 0.1–1.0)
Monocytes Relative: 5 %
Neutro Abs: 4 10*3/uL (ref 1.7–7.7)
Neutrophils Relative %: 69 %
Platelets: 220 10*3/uL (ref 150–400)
RBC: 3.97 MIL/uL (ref 3.87–5.11)
RDW: 13.9 % (ref 11.5–15.5)
WBC: 5.7 10*3/uL (ref 4.0–10.5)
nRBC: 0 % (ref 0.0–0.2)

## 2019-02-18 LAB — BASIC METABOLIC PANEL
Anion gap: 6 (ref 5–15)
BUN: 8 mg/dL (ref 8–23)
CO2: 21 mmol/L — ABNORMAL LOW (ref 22–32)
Calcium: 8.2 mg/dL — ABNORMAL LOW (ref 8.9–10.3)
Chloride: 114 mmol/L — ABNORMAL HIGH (ref 98–111)
Creatinine, Ser: 0.63 mg/dL (ref 0.44–1.00)
GFR calc Af Amer: >60 mL/min (ref >60)
GFR calc non Af Amer: >60 mL/min (ref >60)
Glucose, Bld: 98 mg/dL (ref 70–99)
Potassium: 3.8 mmol/L (ref 3.5–5.1)
Sodium: 141 mmol/L (ref 135–145)

## 2019-02-18 LAB — RESPIRATORY PANEL BY PCR

## 2019-02-18 LAB — PROCALCITONIN: Procalcitonin: 0.83 ng/mL

## 2019-02-18 LAB — TSH: TSH: 0.453 u[IU]/mL (ref 0.350–4.500)

## 2019-02-18 LAB — AMMONIA: Ammonia: 14 umol/L (ref 9–35)

## 2019-02-18 LAB — VITAMIN B12: Vitamin B-12: 265 pg/mL (ref 180–914)

## 2019-02-18 LAB — MAGNESIUM: Magnesium: 1.6 mg/dL — ABNORMAL LOW (ref 1.7–2.4)

## 2019-02-18 LAB — GLUCOSE, CAPILLARY: Glucose-Capillary: 102 mg/dL — ABNORMAL HIGH (ref 70–99)

## 2019-02-18 LAB — FOLATE: Folate: 11.4 ng/mL (ref >5.9)

## 2019-02-18 MED ORDER — MAGNESIUM SULFATE 2 GM/50ML IV SOLN
2.0000 g | Freq: Once | INTRAVENOUS | Status: AC
  Administered 2019-02-18: 2 g via INTRAVENOUS
  Filled 2019-02-18: qty 50

## 2019-02-18 MED ORDER — OXYCODONE HCL 5 MG PO TABS
10.0000 mg | ORAL_TABLET | Freq: Four times a day (QID) | ORAL | Status: DC | PRN
  Administered 2019-02-19 – 2019-02-20 (×4): 10 mg via ORAL
  Filled 2019-02-18 (×4): qty 2

## 2019-02-18 MED ORDER — NALOXONE HCL 0.4 MG/ML IJ SOLN
0.4000 mg | Freq: Once | INTRAMUSCULAR | Status: AC
  Administered 2019-02-18: 17:00:00 0.4 mg via INTRAVENOUS

## 2019-02-18 MED ORDER — NALOXONE HCL 0.4 MG/ML IJ SOLN
INTRAMUSCULAR | Status: AC
  Filled 2019-02-18: qty 1

## 2019-02-18 MED ORDER — ALPRAZOLAM 0.25 MG PO TABS
0.2500 mg | ORAL_TABLET | Freq: Three times a day (TID) | ORAL | Status: DC | PRN
  Administered 2019-02-19 – 2019-02-20 (×3): 0.25 mg via ORAL
  Filled 2019-02-18 (×3): qty 1

## 2019-02-18 NOTE — Progress Notes (Signed)
PROGRESS NOTE  Samantha Carey AVW:[id number] DOB: 01-05-1946 DOA: 02/16/2019 PCP: Raynelle Jan, MD  Brief History:  73 year old female with a history of COPD, chronic pain syndrome, hypertension, hyperlipidemia presenting with 1 day history of fevers and associated nausea and vomiting.  She is a difficult historian, but ultimately is able to provide her clinical history.  She denies any recent travels or sick contacts.  She denies any recent headache, neck pain, chest pain, shortness breath, coughing, hemoptysis, diarrhea, abdominal pain, dysuria, hematuria.  She denies any arthralgias or anosmia.  Upon presentation, the patient was noted to have temperature of 101.0 F with WBC 16.2 and lactic acid 2.3.  She was started on vancomycin and cefepime, and metronidazole.  She was started on IV fluids.  CT of the abdomen and pelvis on 02/16/2019 was negative for any acute findings except for distended urinary bladder.  Assessment/Plan: Sepsis -present on admission -presented with fever, leukocytosis and elevated lactic acid -possibly due to bacteremia -COVID NAA--neg -lactic acid peaked 2.3 -follow blood culture -UA--neg for pyuria -personally reviewed CXR--no infiltrates -continue vancomycin -check procalcitonin--1.22>>0.83  Acute metabolic/toxic encephalopathy -02/18/19 pt lethargic and confused -CBG 102, vitals stable -some improvement after narcan -decrease opioids and benzos -check ammonia -ABG--7.44/28/82/22 on RA -B12 -TSH -folate -CT brain -EEG  Bacteremia -GPC in one of 2 sets -continue vancomycin pending final culture data  Malar Exanthem -viral exanthem vs red man syndrome (pt currently has vanco infusing) -viral respiratory panel--neg -repeat COVID--neg  Chronic Pain Syndrome -PMP AWARE--queried--no red flags -decrease dose oxycodone and alprazolam as discussed above -continue gabapentin  Nausea and Vomiting -likely due to  gastritis -resolved -lipase 21 -02/16/19--CT abd--neg for acute findings  Depression and Anxiety -continue buproprion, alprazolam, trazodone  Hyperlipidemia -continue statin  COPD -stable on RA -continue Dulera  Essential Hypertension -holding Maxzide  Hypokalemia/Hypomagnesemia -replete -check mag      Disposition Plan:   Home in 1-2 days  Family Communication:   Family at bedside  Consultants:  none  Code Status:  FULL   DVT Prophylaxis:  [place] Heparin / [place] Lovenox   Procedures: As Listed in Progress Note Above  Antibiotics: None      Subjective: Patient denies fevers, chills, headache, chest pain, dyspnea, nausea, vomiting, diarrhea, abdominal pain.     Objective: Vitals:   02/17/19 2123 02/18/19 0544 02/18/19 0751 02/18/19 1553  BP: (!) 158/88 (!) 143/67  (!) 150/76  Pulse: (!) 116 95  90  Resp: Temp: 99.1 F (37.3 C) 98.9 F (37.2 C)  98 F (36.7 C)  TempSrc: Oral Oral  Oral  SpO2: 100% 96% 96% 96%  Weight:      Height:        Intake/Output Summary (Last 24 hours) at 02/18/2019 1811 Last data filed at 02/18/2019 8295 Gross per 24 hour  Intake 1598.36 ml  Output -  Net 1598.36 ml   Weight change:  Exam:   General:  Pt is alert, follows commands appropriately, not in acute distress  HEENT: No icterus, No thrush, No neck mass, [place]/AT  Cardiovascular: RRR, S1/S2, no rubs, no gallops  Respiratory: bibasilar crackles, no wheeze  Abdomen: Soft/+BS, non tender, non distended, no guarding  Extremities: trace edema, No lymphangitis, No petechiae, No rashes, no synovitis   Data Reviewed: I have personally reviewed following labs and imaging studies Basic Metabolic Panel: Recent Labs  Lab 02/16/19 1212 02/17/19 0514 02/18/19 0525  NA 139  140 141  K 4.0 3.2* 3.8  CL 105 110 114*  CO2 22 22 21*  GLUCOSE 124* 109* 98  BUN 12 9 8   CREATININE 0.84 0.65 0.63  CALCIUM 9.3 8.2* 8.2*  MG  --   --  1.6*    Liver Function Tests: Recent Labs  Lab 02/16/19 1212  AST 23  ALT 22  ALKPHOS 125  BILITOT 0.8  PROT 6.8  ALBUMIN 3.8   Recent Labs  Lab 02/16/19 1212  LIPASE 21   No results for input(s): AMMONIA in the last 168 hours. Coagulation Profile: No results for input(s): INR, PROTIME in the last 168 hours. CBC: Recent Labs  Lab 02/16/19 1212 02/17/19 0514 02/18/19 0525  WBC 16.2* 8.8 5.7  NEUTROABS 13.8*  --  4.0  HGB 13.9 12.8 11.3*  HCT 42.8 39.9 36.5  MCV 88.4 90.5 91.9  PLT 241 254 220   Cardiac Enzymes: No results for input(s): CKTOTAL, CKMB, CKMBINDEX, TROPONINI in the last 168 hours. BNP: Invalid input(s): POCBNP CBG: Recent Labs  Lab 02/18/19 1602  GLUCAP 102*   HbA1C: No results for input(s): HGBA1C in the last 72 hours. Urine analysis:    Component Value Date/Time   COLORURINE YELLOW 02/16/2019 1146   APPEARANCEUR CLEAR 02/16/2019 1146   LABSPEC 1.009 02/16/2019 1146   PHURINE 6.0 02/16/2019 1146   GLUCOSEU NEGATIVE 02/16/2019 1146   HGBUR NEGATIVE 02/16/2019 1146   BILIRUBINUR NEGATIVE 02/16/2019 1146   KETONESUR NEGATIVE 02/16/2019 1146   PROTEINUR NEGATIVE 02/16/2019 1146   NITRITE NEGATIVE 02/16/2019 1146   LEUKOCYTESUR NEGATIVE 02/16/2019 1146   Sepsis Labs: @LABRCNTIP (procalcitonin:4,lacticidven:4) ) Recent Results (from the past 240 hour(s))  Urine culture     Status: Abnormal   Collection Time: 02/16/19 12:24 PM   Specimen: Urine, Random  Result Value Ref Range Status   Specimen Description   Final    URINE, RANDOM Performed at [facility], [address] [place], [place] [address]    Special Requests   Final    NONE Performed at [facility], [address] [place], [place] [address]    Culture MULTIPLE SPECIES PRESENT, SUGGEST RECOLLECTION (A)  Final   Report Status 02/17/2019 FINAL  Final  SARS Coronavirus 2 (CEPHEID- Performed in [facility] hospital lab), Hosp Order     Status: None   Collection Time: 02/16/19 12:26  PM   Specimen: Nasopharyngeal Swab  Result Value Ref Range Status   SARS Coronavirus 2 NEGATIVE NEGATIVE Final    Comment: (NOTE) If result is NEGATIVE SARS-CoV-2 target nucleic acids are NOT DETECTED. The SARS-CoV-2 RNA is generally detectable in upper and lower  respiratory specimens during the acute phase of infection. The lowest  concentration of SARS-CoV-2 viral copies this assay can detect is 250  copies / mL. A negative result does not preclude SARS-CoV-2 infection  and should not be used as the sole basis for treatment or other  patient management decisions.  A negative result may occur with  improper specimen collection / handling, submission of specimen other  than nasopharyngeal swab, presence of viral mutation(s) within the  areas targeted by this assay, and inadequate number of viral copies  (<250 copies / mL). A negative result must be combined with clinical  observations, patient history, and epidemiological information. If result is POSITIVE SARS-CoV-2 target nucleic acids are DETECTED. The SARS-CoV-2 RNA is generally detectable in upper and lower  respiratory specimens dur ing the acute phase of infection.  Positive  results are indicative of  active infection with SARS-CoV-2.  Clinical  correlation with patient history and other diagnostic information is  necessary to determine patient infection status.  Positive results do  not rule out bacterial infection or co-infection with other viruses. If result is PRESUMPTIVE POSTIVE SARS-CoV-2 nucleic acids MAY BE PRESENT.   A presumptive positive result was obtained on the submitted specimen  and confirmed on repeat testing.  While 2019 novel coronavirus  (SARS-CoV-2) nucleic acids may be present in the submitted sample  additional confirmatory testing may be necessary for epidemiological  and / or clinical management purposes  to differentiate between  SARS-CoV-2 and other Sarbecovirus currently known to infect humans.   If clinically indicated additional testing with an alternate test  methodology [phone]) is advised. The SARS-CoV-2 RNA is generally  detectable in upper and lower respiratory sp ecimens during the acute  phase of infection. The expected result is Negative. Fact Sheet for Patients:  BoilerBrush.com.cy Fact Sheet for Healthcare Providers: https://pope.com/ This test is not yet approved or cleared by the [place] FDA and has been authorized for detection and/or diagnosis of SARS-CoV-2 by FDA under an Emergency Use Authorization (EUA).  This EUA will remain in effect (meaning this test can be used) for the duration of the COVID-19 declaration under Section 564(b)(1) of the Act, 21 U.S.C. section 360bbb-3(b)(1), unless the authorization is terminated or revoked sooner. Performed at [facility], [address] [place], [place] [address]   Blood Culture (routine x 2)     Status: None (Preliminary result)   Collection Time: 02/16/19 12:43 PM   Specimen: BLOOD RIGHT HAND  Result Value Ref Range Status   Specimen Description BLOOD RIGHT HAND  Final   Special Requests   Final    BOTTLES DRAWN AEROBIC AND ANAEROBIC Blood Culture adequate volume   Culture   Final    NO GROWTH 2 DAYS Performed at [facility], [address], [place], [place] [address]    Report Status PENDING  Incomplete  Blood Culture (routine x 2)     Status: None (Preliminary result)   Collection Time: 02/16/19 12:47 PM   Specimen: BLOOD LEFT ARM  Result Value Ref Range Status   Specimen Description   Final    BLOOD LEFT ARM Performed at [facility], [address], [place], [place] [address]    Special Requests   Final    BOTTLES DRAWN AEROBIC AND ANAEROBIC Blood Culture adequate volume Performed at [facility], [address] [place], [place] [address]    Culture  Setup Time   Final    GRAM POSITIVE COCCI BOTH AEROBIC AND ANAEROBIC BOTTLES Gram Stain Report  Called to,Read Back By and Verified With: BENSON D. AT [id number] ON [id number] BY THOMPSON S. CRITICAL RESULT CALLED TO, READ BACK BY AND VERIFIED WITH: PHARMD G COFFEE [id number] AT 1405 BY CM Performed at [facility], [address] [place], [place] [address]    Culture GRAM POSITIVE COCCI  Final   Report Status PENDING  Incomplete  Blood Culture ID Panel (Reflexed)     Status: Abnormal   Collection Time: 02/16/19 12:47 PM  Result Value Ref Range Status   Enterococcus species NOT DETECTED NOT DETECTED Final   Listeria monocytogenes NOT DETECTED NOT DETECTED Final   Staphylococcus species DETECTED (A) NOT DETECTED Final    Comment: Methicillin (oxacillin) resistant coagulase negative staphylococcus. Possible blood culture contaminant (unless isolated from more than one blood culture draw or clinical case suggests pathogenicity). No antibiotic treatment  is indicated for blood  culture contaminants. CRITICAL RESULT CALLED TO, READ BACK BY AND VERIFIED WITH: PHARMD G COFFEE [id number] AT 1405 BY CM    Staphylococcus aureus (BCID) NOT DETECTED NOT DETECTED Final   Methicillin resistance DETECTED (A) NOT DETECTED Final    Comment: CRITICAL RESULT CALLED TO, READ BACK BY AND VERIFIED WITH: PHARMD G COFFEE [phone] AT 1405 BY CM    Streptococcus species NOT DETECTED NOT DETECTED Final   Streptococcus agalactiae NOT DETECTED NOT DETECTED Final   Streptococcus pneumoniae NOT DETECTED NOT DETECTED Final   Streptococcus pyogenes NOT DETECTED NOT DETECTED Final   Acinetobacter baumannii NOT DETECTED NOT DETECTED Final   Enterobacteriaceae species NOT DETECTED NOT DETECTED Final   Enterobacter cloacae complex NOT DETECTED NOT DETECTED Final   Escherichia coli NOT DETECTED NOT DETECTED Final   Klebsiella oxytoca NOT DETECTED NOT DETECTED Final   Klebsiella pneumoniae NOT DETECTED NOT DETECTED Final   Proteus species NOT DETECTED NOT DETECTED Final   Serratia marcescens NOT DETECTED NOT DETECTED Final    Haemophilus influenzae NOT DETECTED NOT DETECTED Final   Neisseria meningitidis NOT DETECTED NOT DETECTED Final   Pseudomonas aeruginosa NOT DETECTED NOT DETECTED Final   Candida albicans NOT DETECTED NOT DETECTED Final   Candida glabrata NOT DETECTED NOT DETECTED Final   Candida krusei NOT DETECTED NOT DETECTED Final   Candida parapsilosis NOT DETECTED NOT DETECTED Final   Candida tropicalis NOT DETECTED NOT DETECTED Final    Comment: Performed at [facility], [address] [place], [place] [address]  SARS Coronavirus 2 (CEPHEID - Performed in [facility] hospital lab), Hosp Order     Status: None   Collection Time: 02/17/19 11:27 AM   Specimen: Nasopharyngeal Swab  Result Value Ref Range Status   SARS Coronavirus 2 NEGATIVE NEGATIVE Final    Comment: (NOTE) If result is NEGATIVE SARS-CoV-2 target nucleic acids are NOT DETECTED. The SARS-CoV-2 RNA is generally detectable in upper and lower  respiratory specimens during the acute phase of infection. The lowest  concentration of SARS-CoV-2 viral copies this assay can detect is 250  copies / mL. A negative result does not preclude SARS-CoV-2 infection  and should not be used as the sole basis for treatment or other  patient management decisions.  A negative result may occur with  improper specimen collection / handling, submission of specimen other  than nasopharyngeal swab, presence of viral mutation(s) within the  areas targeted by this assay, and inadequate number of viral copies  (<250 copies / mL). A negative result must be combined with clinical  observations, patient history, and epidemiological information. If result is POSITIVE SARS-CoV-2 target nucleic acids are DETECTED. The SARS-CoV-2 RNA is generally detectable in upper and lower  respiratory specimens dur ing the acute phase of infection.  Positive  results are indicative of active infection with SARS-CoV-2.  Clinical  correlation with patient history and other  diagnostic information is  necessary to determine patient infection status.  Positive results do  not rule out bacterial infection or co-infection with other viruses. If result is PRESUMPTIVE POSTIVE SARS-CoV-2 nucleic acids MAY BE PRESENT.   A presumptive positive result was obtained on the submitted specimen  and confirmed on repeat testing.  While 2019 novel coronavirus  (SARS-CoV-2) nucleic acids may be present in the submitted sample  additional confirmatory testing may be necessary for epidemiological  and / or clinical management purposes  to differentiate between  SARS-CoV-2 and other Sarbecovirus currently known to  infect humans.  If clinically indicated additional testing with an alternate test  methodology [phone]) is advised. The SARS-CoV-2 RNA is generally  detectable in upper and lower respiratory sp ecimens during the acute  phase of infection. The expected result is Negative. Fact Sheet for Patients:  StrictlyIdeas.no Fact Sheet for Healthcare Providers: BankingDealers.co.za This test is not yet approved or cleared by the [place] FDA and has been authorized for detection and/or diagnosis of SARS-CoV-2 by FDA under an Emergency Use Authorization (EUA).  This EUA will remain in effect (meaning this test can be used) for the duration of the COVID-19 declaration under Section 564(b)(1) of the Act, 21 U.S.C. section 360bbb-3(b)(1), unless the authorization is terminated or revoked sooner. Performed at [facility], [address], [place], [place] [address]   Respiratory Panel by PCR     Status: None   Collection Time: 02/17/19 10:05 PM   Specimen: Nasopharyngeal Swab; Respiratory  Result Value Ref Range Status   Adenovirus NOT DETECTED NOT DETECTED Final   Coronavirus 229E NOT DETECTED NOT DETECTED Final    Comment: (NOTE) The Coronavirus on the Respiratory Panel, DOES NOT test for the novel  Coronavirus (2019  nCoV)    Coronavirus HKU1 NOT DETECTED NOT DETECTED Final   Coronavirus NL63 NOT DETECTED NOT DETECTED Final   Coronavirus OC43 NOT DETECTED NOT DETECTED Final   Metapneumovirus NOT DETECTED NOT DETECTED Final   Rhinovirus / Enterovirus NOT DETECTED NOT DETECTED Final   Influenza A NOT DETECTED NOT DETECTED Final   Influenza B NOT DETECTED NOT DETECTED Final   Parainfluenza Virus 1 NOT DETECTED NOT DETECTED Final   Parainfluenza Virus 2 NOT DETECTED NOT DETECTED Final   Parainfluenza Virus 3 NOT DETECTED NOT DETECTED Final   Parainfluenza Virus 4 NOT DETECTED NOT DETECTED Final   Respiratory Syncytial Virus NOT DETECTED NOT DETECTED Final   Bordetella pertussis NOT DETECTED NOT DETECTED Final   Chlamydophila pneumoniae NOT DETECTED NOT DETECTED Final   Mycoplasma pneumoniae NOT DETECTED NOT DETECTED Final    Comment: Performed at [facility], [place] [address] [place], [place] [address]     Scheduled Meds: . atorvastatin  80 mg Oral q1800  . bisacodyl  10 mg Rectal Once  . buPROPion  300 mg Oral Daily  . gabapentin  600 mg Oral TID  . heparin  5,000 Units Subcutaneous Q8H  . mometasone-formoterol  2 puff Inhalation BID  . polyethylene glycol  17 g Oral Daily  . senna-docusate  2 tablet Oral BID  . sodium chloride flush  3 mL Intravenous Q12H   Continuous Infusions: . sodium chloride    . 0.9 % NaCl with KCl 20 mEq / L 100 mL/hr at 02/17/19 2318  . vancomycin 1,000 mg (02/18/19 0907)    Procedures/Studies: Ct Abdomen Pelvis W Contrast  Result Date: 02/16/2019 CLINICAL DATA:  Abdominal pain and nausea and vomiting beginning yesterday. Fever. EXAM: CT ABDOMEN AND PELVIS WITH CONTRAST TECHNIQUE: Multidetector CT imaging of the abdomen and pelvis was performed using the standard protocol following bolus administration of intravenous contrast. CONTRAST:  166mL OMNIPAQUE IOHEXOL 300 MG/ML  SOLN COMPARISON:  None. FINDINGS: Lower Chest: No acute findings. Hepatobiliary: No  hepatic masses identified. Gallbladder is unremarkable. Pancreas:  No mass or inflammatory changes. Spleen: Within normal limits in size and appearance. Adrenals/Urinary Tract: No masses identified. No evidence of ureteral calculi or hydronephrosis. The urinary bladder is markedly distended but otherwise unremarkable in appearance. Stomach/Bowel: No evidence of obstruction, inflammatory process  or abnormal fluid collections. Normal appendix visualized. Vascular/Lymphatic: No pathologically enlarged lymph nodes. No abdominal aortic aneurysm. Aortic atherosclerosis. Reproductive: Prior hysterectomy noted. Adnexal regions are unremarkable in appearance. Other:  None. Musculoskeletal:  No suspicious bone lesions identified. IMPRESSION: Markedly distended urinary bladder. Recommend clinical correlation for urinary retention. No other acute findings. Aortic Atherosclerosis (ICD10-I70.0). Electronically Signed   By: Myles RosenthalJohn  Stahl M.D.   On: 02/16/2019 16:38   Dg Chest Port 1 View  Result Date: 02/16/2019 CLINICAL DATA:  Fever and weakness today. EXAM: PORTABLE CHEST 1 VIEW COMPARISON:  Single-view of the chest 09/10/2016. PA and lateral chest 05/15/2016. FINDINGS: The lungs are clear. Heart size is normal. No pneumothorax or pleural fluid. No acute or focal bony abnormality. IMPRESSION: No acute disease. Electronically Signed   By: Drusilla Kannerhomas  Dalessio M.D.   On: 02/16/2019 12:04    Catarina Hartshornavid Iline Buchinger, DO  [facility] Pager [phone]  If 7PM-7AM, please contact night-coverage [email or web address] Password [email or web address] 02/18/2019, 6:11 PM   LOS: 1 day

## 2019-02-18 NOTE — Progress Notes (Signed)
Patient is now laying in bed and responds to verbal and physical stimuli. When asked if patient knows her name or knows where she is she simply stares at staff. When asked if patient wants staff to place dinner tray in front of her to eat dinner she responds with "get out of my face, get out of my room and go f$$$ yourself". Patient is visibly upset but closes her eyes and lays backs down in bed. Patient does not desire to eat at this time, will attempt again at a later time.

## 2019-02-18 NOTE — Progress Notes (Signed)
Patient was found with covers thrown off of her and had had a bowel movement in the bed. Patient was cleaned by nurse and nurse tech and barrier cream was applied to her butt per her request. Patient was shown how to use the call bell to call nurse, use the tv, and turn the lights on and off. Heat was turned up in room per patient request and extra blanket was provided. During the entirety of this interaction the patient cursed this nurse and nurse tech, Aldona Bar out and stated that we "better be glad I don't strike you".

## 2019-02-18 NOTE — Progress Notes (Signed)
Patient lethargic and responsive to name and sternum rub but falls back to sleep. Patient was found to be diaphoretic however all vital signs are within normal limits and blood glucose was 102. Patient does not open her eyes to verbal stimuli but will mumble. MD has been made aware.

## 2019-02-19 LAB — CBC
HCT: 33.9 % — ABNORMAL LOW (ref 36.0–46.0)
Hemoglobin: 10.9 g/dL — ABNORMAL LOW (ref 12.0–15.0)
MCH: 29.3 pg (ref 26.0–34.0)
MCHC: 32.2 g/dL (ref 30.0–36.0)
MCV: 91.1 fL (ref 80.0–100.0)
Platelets: 220 10*3/uL (ref 150–400)
RBC: 3.72 MIL/uL — ABNORMAL LOW (ref 3.87–5.11)
RDW: 13.9 % (ref 11.5–15.5)
WBC: 5 10*3/uL (ref 4.0–10.5)
nRBC: 0 % (ref 0.0–0.2)

## 2019-02-19 LAB — COMPREHENSIVE METABOLIC PANEL
ALT: 19 U/L (ref 0–44)
AST: 21 U/L (ref 15–41)
Albumin: 3.2 g/dL — ABNORMAL LOW (ref 3.5–5.0)
Alkaline Phosphatase: 81 U/L (ref 38–126)
Anion gap: 10 (ref 5–15)
BUN: 10 mg/dL (ref 8–23)
CO2: 22 mmol/L (ref 22–32)
Calcium: 8.6 mg/dL — ABNORMAL LOW (ref 8.9–10.3)
Chloride: 114 mmol/L — ABNORMAL HIGH (ref 98–111)
Creatinine, Ser: 0.63 mg/dL (ref 0.44–1.00)
GFR calc Af Amer: >60 mL/min (ref >60)
GFR calc non Af Amer: >60 mL/min (ref >60)
Glucose, Bld: 94 mg/dL (ref 70–99)
Potassium: 3.8 mmol/L (ref 3.5–5.1)
Sodium: 146 mmol/L — ABNORMAL HIGH (ref 135–145)
Total Bilirubin: 0.4 mg/dL (ref 0.3–1.2)
Total Protein: 5.8 g/dL — ABNORMAL LOW (ref 6.5–8.1)

## 2019-02-19 LAB — PROCALCITONIN: Procalcitonin: 0.39 ng/mL

## 2019-02-19 LAB — MAGNESIUM: Magnesium: 2.1 mg/dL (ref 1.7–2.4)

## 2019-02-19 LAB — T4, FREE: Free T4: 0.71 ng/dL (ref 0.61–1.12)

## 2019-02-19 MED ORDER — GABAPENTIN 300 MG PO CAPS
300.0000 mg | ORAL_CAPSULE | Freq: Three times a day (TID) | ORAL | Status: DC
  Administered 2019-02-19 – 2019-02-20 (×2): 300 mg via ORAL
  Filled 2019-02-19 (×3): qty 1

## 2019-02-19 MED ORDER — POTASSIUM CHLORIDE IN NACL 20-0.45 MEQ/L-% IV SOLN
INTRAVENOUS | Status: DC
  Administered 2019-02-19 – 2019-02-20 (×2): via INTRAVENOUS

## 2019-02-19 NOTE — Progress Notes (Signed)
Pt uncooperative with EEG, RN notified.

## 2019-02-19 NOTE — Progress Notes (Signed)
Pt started yelling and cursing at staff and stating she has been laying naked and cold all day. Staff has cleaned pt up multiple times throughout the day with fresh linen change. Pt is currently refusing staff to help clean pt up after multiple offers. Pt is currently refusing medications. Nurse stated to pt to call when she is ready to cooperate and be clean and we be will happy to do so.

## 2019-02-19 NOTE — Progress Notes (Signed)
EEG attempted twice. Dr.Tat came in and tried to explain importance of procedure. Patient is continuing to refuse EEG testing at this time.

## 2019-02-19 NOTE — Progress Notes (Signed)
PROGRESS NOTE  Samantha Carey Pierro RUE:[id number] DOB: 12/13/1945 DOA: 02/16/2019 PCP: Raynelle Janoletta, Harry M, MD  Brief History: 73 year old female with a history of COPD, chronic pain syndrome, hypertension, hyperlipidemia presenting with 1 day history of fevers and associated nausea and vomiting. She is a difficult historian, but ultimately is able to provide her clinical history. She denies any recent travels or sick contacts. She denies any recent headache, neck pain, chest pain, shortness breath, coughing, hemoptysis, diarrhea, abdominal pain, dysuria, hematuria. She denies any arthralgias or anosmia. Upon presentation, the patient was noted to have temperature of 101.0 F with WBC 16.2 and lactic acid 2.3. She was started on vancomycin and cefepime, and metronidazole. She was started on IV fluids. CT of the abdomen and pelvis on 02/16/2019 was negative for any acute findings except for distended urinary bladder.  Assessment/Plan: Sepsis--undifferentiated -present on admission -presented with fever, leukocytosis and elevated lactic acid -COVID NAA--neg -lactic acid peaked 2.3 -follow blood culture--neg -UA--neg for pyuria -personally reviewed CXR--no infiltrates -continue vancomycin -check procalcitonin--1.22>>0.83>>0.39  Acute metabolic/toxic encephalopathy -02/18/19 pt lethargic and confused -CBG 102, vitals stable -some improvement after narcan -decrease opioids and benzos-->improved mentation -ABG--7.44/28/82/22 on RA -B12--265 -TSH--0.453 -folate--11.4 -ammonia--14 -CT brain--neg -EEG--pt refused despite discussing risk, benefits, alternatives  Bacteremia -CoNS in one of 2 sets -likely contaminant -d/c vanco  Malar Exanthem -viral exanthem vs red man syndrome  -viral respiratory panel--neg -repeat COVID--neg -developed after fever developed -improving now  Chronic Pain Syndrome -PMP AWARE--queried--no red flags -she has chronic back pain--on home  oxycodone 20 mg q 6 and alprazolam -decrease dose oxycodone and alprazolam as discussed above -continue gabapentin  Nausea and Vomiting -likely due to gastritis -resolved -lipase 21 -02/16/19--CT abd--neg for acute findings  Depression and Anxiety -continue buproprion, alprazolam, trazodone  Hyperlipidemia -continue statin  COPD -stable on RA -continue Dulera  Essential Hypertension -holding Maxzide  Hypokalemia/Hypomagnesemia -replete -check mag--2.1  Disposition Plan: Home 6/20 if afebrile  Family Communication: spouse updated 6/19  Consultants:none  Code Status: FULL   DVT Prophylaxis: [place] Heparin    Procedures: As Listed in Progress Note Above  Antibiotics: vanco 6/16>>6/19 Cefepime 6/16    Subjective: Pt complains of chronic backpain.  Denies worsen lower leg weakness. No f/c, cp, sob, n/v/d, abd pain  Objective: Vitals:   02/18/19 1922 02/18/19 2102 02/19/19 0627 02/19/19 0743  BP:  122/63 116/90   Pulse:  84 73   Resp:  18 20   Temp:  98.8 F (37.1 C) 98.7 F (37.1 C)   TempSrc:  Oral Oral   SpO2: 96% 95% 97% 95%  Weight:      Height:        Intake/Output Summary (Last 24 hours) at 02/19/2019 1707 Last data filed at 02/19/2019 [id number] Gross per 24 hour  Intake 240 ml  Output 4 ml  Net 236 ml   Weight change:  Exam:   General:  Pt is alert, follows commands appropriately, not in acute distress  HEENT: No icterus, No thrush, No neck mass, [place]/AT  Cardiovascular: RRR, S1/S2, no rubs, no gallops  Respiratory: CTA bilaterally, no wheezing, no crackles, no rhonchi  Abdomen: Soft/+BS, non tender, non distended, no guarding  Extremities: No edema, No lymphangitis, No petechiae, No rashes, no synovitis   Data Reviewed: I have personally reviewed following labs and imaging studies Basic Metabolic Panel: Recent Labs  Lab 02/16/19 1212 02/17/19 0514 02/18/19 0525 02/19/19 0538  NA 139 140 141 146*  K  4.0 3.2*  3.8 3.8  CL 105 110 114* 114*  CO2 22 22 21* 22  GLUCOSE 124* 109* 98 94  BUN CREATININE 0.84 0.65 0.63 0.63  CALCIUM 9.3 8.2* 8.2* 8.6*  MG  --   --  1.6* 2.1   Liver Function Tests: Recent Labs  Lab 02/16/19 1212 02/19/19 0538  AST 23 21  ALT 22 19  ALKPHOS 125 81  BILITOT 0.8 0.4  PROT 6.8 5.8*  ALBUMIN 3.8 3.2*   Recent Labs  Lab 02/16/19 1212  LIPASE 21   Recent Labs  Lab 02/18/19 1914  AMMONIA 14   Coagulation Profile: No results for input(s): INR, PROTIME in the last 168 hours. CBC: Recent Labs  Lab 02/16/19 1212 02/17/19 0514 02/18/19 0525 02/19/19 0538  WBC 16.2* 8.8 5.7 5.0  NEUTROABS 13.8*  --  4.0  --   HGB 13.9 12.8 11.3* 10.9*  HCT 42.8 39.9 36.5 33.9*  MCV 88.4 90.5 91.9 91.1  PLT 241 254 220 220   Cardiac Enzymes: No results for input(s): CKTOTAL, CKMB, CKMBINDEX, TROPONINI in the last 168 hours. BNP: Invalid input(s): POCBNP CBG: Recent Labs  Lab 02/18/19 1602  GLUCAP 102*   HbA1C: No results for input(s): HGBA1C in the last 72 hours. Urine analysis:    Component Value Date/Time   COLORURINE YELLOW 02/16/2019 1146   APPEARANCEUR CLEAR 02/16/2019 1146   LABSPEC 1.009 02/16/2019 1146   PHURINE 6.0 02/16/2019 1146   GLUCOSEU NEGATIVE 02/16/2019 1146   HGBUR NEGATIVE 02/16/2019 1146   BILIRUBINUR NEGATIVE 02/16/2019 1146   KETONESUR NEGATIVE 02/16/2019 1146   PROTEINUR NEGATIVE 02/16/2019 1146   NITRITE NEGATIVE 02/16/2019 1146   LEUKOCYTESUR NEGATIVE 02/16/2019 1146   Sepsis Labs: (procalcitonin:4,lacticidven:4) ) Recent Results (from the past 240 hour(s))  Urine culture     Status: Abnormal   Collection Time: 02/16/19 12:24 PM   Specimen: Urine, Random  Result Value Ref Range Status   Specimen Description   Final    URINE, RANDOM Performed at [facility], [address], [place], [place] [address]    Special Requests   Final    NONE Performed at [facility], [address] [place], [place]  [address]    Culture MULTIPLE SPECIES PRESENT, SUGGEST RECOLLECTION (A)  Final   Report Status 02/17/2019 FINAL  Final  SARS Coronavirus 2 (CEPHEID- Performed in [facility] hospital lab), Hosp Order     Status: None   Collection Time: 02/16/19 12:26 PM   Specimen: Nasopharyngeal Swab  Result Value Ref Range Status   SARS Coronavirus 2 NEGATIVE NEGATIVE Final    Comment: (NOTE) If result is NEGATIVE SARS-CoV-2 target nucleic acids are NOT DETECTED. The SARS-CoV-2 RNA is generally detectable in upper and lower  respiratory specimens during the acute phase of infection. The lowest  concentration of SARS-CoV-2 viral copies this assay can detect is 250  copies / mL. A negative result does not preclude SARS-CoV-2 infection  and should not be used as the sole basis for treatment or other  patient management decisions.  A negative result may occur with  improper specimen collection / handling, submission of specimen other  than nasopharyngeal swab, presence of viral mutation(s) within the  areas targeted by this assay, and inadequate number of viral copies  (<250 copies / mL). A negative result must be combined with clinical  observations, patient history, and epidemiological information. If result is POSITIVE SARS-CoV-2 target nucleic acids are DETECTED. The SARS-CoV-2 RNA is  generally detectable in upper and lower  respiratory specimens dur ing the acute phase of infection.  Positive  results are indicative of active infection with SARS-CoV-2.  Clinical  correlation with patient history and other diagnostic information is  necessary to determine patient infection status.  Positive results do  not rule out bacterial infection or co-infection with other viruses. If result is PRESUMPTIVE POSTIVE SARS-CoV-2 nucleic acids MAY BE PRESENT.   A presumptive positive result was obtained on the submitted specimen  and confirmed on repeat testing.  While 2019 novel coronavirus  (SARS-CoV-2) nucleic  acids may be present in the submitted sample  additional confirmatory testing may be necessary for epidemiological  and / or clinical management purposes  to differentiate between  SARS-CoV-2 and other Sarbecovirus currently known to infect humans.  If clinically indicated additional testing with an alternate test  methodology [phone]) is advised. The SARS-CoV-2 RNA is generally  detectable in upper and lower respiratory sp ecimens during the acute  phase of infection. The expected result is Negative. Fact Sheet for Patients:  BoilerBrush.com.cy Fact Sheet for Healthcare Providers: https://pope.com/ This test is not yet approved or cleared by the [place] FDA and has been authorized for detection and/or diagnosis of SARS-CoV-2 by FDA under an Emergency Use Authorization (EUA).  This EUA will remain in effect (meaning this test can be used) for the duration of the COVID-19 declaration under Section 564(b)(1) of the Act, 21 U.S.C. section 360bbb-3(b)(1), unless the authorization is terminated or revoked sooner. Performed at [facility], [address] [place], [place] [address]   Blood Culture (routine x 2)     Status: None (Preliminary result)   Collection Time: 02/16/19 12:43 PM   Specimen: BLOOD RIGHT HAND  Result Value Ref Range Status   Specimen Description BLOOD RIGHT HAND  Final   Special Requests   Final    BOTTLES DRAWN AEROBIC AND ANAEROBIC Blood Culture adequate volume   Culture   Final    NO GROWTH 3 DAYS Performed at [facility], [address] [place], [place] [address]    Report Status PENDING  Incomplete  Blood Culture (routine x 2)     Status: Abnormal (Preliminary result)   Collection Time: 02/16/19 12:47 PM   Specimen: BLOOD LEFT ARM  Result Value Ref Range Status   Specimen Description   Final    BLOOD LEFT ARM Performed at [facility], [address], [place], [place] [address]    Special Requests    Final    BOTTLES DRAWN AEROBIC AND ANAEROBIC Blood Culture adequate volume Performed at [facility], [address] [place], [place] [address]    Culture  Setup Time   Final    GRAM POSITIVE COCCI BOTH AEROBIC AND ANAEROBIC BOTTLES Gram Stain Report Called to,Read Back By and Verified With: BENSON D. AT 0814A ON [id number] BY THOMPSON S. CRITICAL RESULT CALLED TO, READ BACK BY AND VERIFIED WITH: PHARMD G COFFEE [id number] AT 1405 BY CM    Culture (A)  Final    STAPHYLOCOCCUS SPECIES (COAGULASE NEGATIVE) THE SIGNIFICANCE OF ISOLATING THIS ORGANISM FROM A SINGLE SET OF BLOOD CULTURES WHEN MULTIPLE SETS ARE DRAWN IS UNCERTAIN. PLEASE NOTIFY THE MICROBIOLOGY DEPARTMENT WITHIN ONE WEEK IF SPECIATION AND SENSITIVITIES ARE REQUIRED. Performed at [facility], [address] [place], [place] [address]    Report Status PENDING  Incomplete  Blood Culture ID Panel (Reflexed)     Status: Abnormal   Collection Time: 02/16/19 12:47 PM  Result Value Ref Range Status   Enterococcus species NOT DETECTED NOT DETECTED Final   Listeria monocytogenes NOT DETECTED NOT DETECTED Final   Staphylococcus species DETECTED (A) NOT DETECTED Final    Comment: Methicillin (oxacillin) resistant coagulase negative staphylococcus. Possible blood culture contaminant (unless isolated from more than one blood culture draw or clinical case suggests pathogenicity). No antibiotic treatment is indicated for blood  culture contaminants. CRITICAL RESULT CALLED TO, READ BACK BY AND VERIFIED WITH: PHARMD G COFFEE [id number] AT 1405 BY CM    Staphylococcus aureus (BCID) NOT DETECTED NOT DETECTED Final   Methicillin resistance DETECTED (A) NOT DETECTED Final    Comment: CRITICAL RESULT CALLED TO, READ BACK BY AND VERIFIED WITH: PHARMD G COFFEE [id number] AT 1405 BY CM    Streptococcus species NOT DETECTED NOT DETECTED Final   Streptococcus agalactiae NOT DETECTED NOT DETECTED Final   Streptococcus pneumoniae NOT DETECTED NOT DETECTED  Final   Streptococcus pyogenes NOT DETECTED NOT DETECTED Final   Acinetobacter baumannii NOT DETECTED NOT DETECTED Final   Enterobacteriaceae species NOT DETECTED NOT DETECTED Final   Enterobacter cloacae complex NOT DETECTED NOT DETECTED Final   Escherichia coli NOT DETECTED NOT DETECTED Final   Klebsiella oxytoca NOT DETECTED NOT DETECTED Final   Klebsiella pneumoniae NOT DETECTED NOT DETECTED Final   Proteus species NOT DETECTED NOT DETECTED Final   Serratia marcescens NOT DETECTED NOT DETECTED Final   Haemophilus influenzae NOT DETECTED NOT DETECTED Final   Neisseria meningitidis NOT DETECTED NOT DETECTED Final   Pseudomonas aeruginosa NOT DETECTED NOT DETECTED Final   Candida albicans NOT DETECTED NOT DETECTED Final   Candida glabrata NOT DETECTED NOT DETECTED Final   Candida krusei NOT DETECTED NOT DETECTED Final   Candida parapsilosis NOT DETECTED NOT DETECTED Final   Candida tropicalis NOT DETECTED NOT DETECTED Final    Comment: Performed at [facility], [address] [place], [place] [address]  SARS Coronavirus 2 (CEPHEID - Performed in [facility] lab), Hosp Order     Status: None   Collection Time: 02/17/19 11:27 AM   Specimen: Nasopharyngeal Swab  Result Value Ref Range Status   SARS Coronavirus 2 NEGATIVE NEGATIVE Final    Comment: (NOTE) If result is NEGATIVE SARS-CoV-2 target nucleic acids are NOT DETECTED. The SARS-CoV-2 RNA is generally detectable in upper and lower  respiratory specimens during the acute phase of infection. The lowest  concentration of SARS-CoV-2 viral copies this assay can detect is 250  copies / mL. A negative result does not preclude SARS-CoV-2 infection  and should not be used as the sole basis for treatment or other  patient management decisions.  A negative result may occur with  improper specimen collection / handling, submission of specimen other  than nasopharyngeal swab, presence of viral mutation(s) within the  areas  targeted by this assay, and inadequate number of viral copies  (<250 copies / mL). A negative result must be combined with clinical  observations, patient history, and epidemiological information. If result is POSITIVE SARS-CoV-2 target nucleic acids are DETECTED. The SARS-CoV-2 RNA is generally detectable in upper and lower  respiratory specimens dur ing the acute phase of infection.  Positive  results are indicative of active infection with SARS-CoV-2.  Clinical  correlation with patient history and other diagnostic information is  necessary to determine patient infection status.  Positive results do  not rule out bacterial infection or co-infection with other viruses. If result is PRESUMPTIVE POSTIVE SARS-CoV-2 nucleic acids MAY BE  PRESENT.   A presumptive positive result was obtained on the submitted specimen  and confirmed on repeat testing.  While 2019 novel coronavirus  (SARS-CoV-2) nucleic acids may be present in the submitted sample  additional confirmatory testing may be necessary for epidemiological  and / or clinical management purposes  to differentiate between  SARS-CoV-2 and other Sarbecovirus currently known to infect humans.  If clinically indicated additional testing with an alternate test  methodology [phone]([id number]) is advised. The SARS-CoV-2 RNA is generally  detectable in upper and lower respiratory sp ecimens during the acute  phase of infection. The expected result is Negative. Fact Sheet for Patients:  BoilerBrush.com.cyhttps://www.fda.gov/media/136312/download Fact Sheet for Healthcare Providers: https://pope.com/https://www.fda.gov/media/136313/download This test is not yet approved or cleared by the [place] FDA and has been authorized for detection and/or diagnosis of SARS-CoV-2 by FDA under an Emergency Use Authorization (EUA).  This EUA will remain in effect (meaning this test can be used) for the duration of the COVID-19 declaration under Section 564(b)(1) of the Act, 21 U.S.C. section  360bbb-3(b)(1), unless the authorization is terminated or revoked sooner. Performed at [facility], [address] [place], [place] [address]   Respiratory Panel by PCR     Status: None   Collection Time: 02/17/19 10:05 PM   Specimen: Nasopharyngeal Swab; Respiratory  Result Value Ref Range Status   Adenovirus NOT DETECTED NOT DETECTED Final   Coronavirus 229E NOT DETECTED NOT DETECTED Final    Comment: (NOTE) The Coronavirus on the Respiratory Panel, DOES NOT test for the novel  Coronavirus (2019 nCoV)    Coronavirus HKU1 NOT DETECTED NOT DETECTED Final   Coronavirus NL63 NOT DETECTED NOT DETECTED Final   Coronavirus OC43 NOT DETECTED NOT DETECTED Final   Metapneumovirus NOT DETECTED NOT DETECTED Final   Rhinovirus / Enterovirus NOT DETECTED NOT DETECTED Final   Influenza A NOT DETECTED NOT DETECTED Final   Influenza B NOT DETECTED NOT DETECTED Final   Parainfluenza Virus 1 NOT DETECTED NOT DETECTED Final   Parainfluenza Virus 2 NOT DETECTED NOT DETECTED Final   Parainfluenza Virus 3 NOT DETECTED NOT DETECTED Final   Parainfluenza Virus 4 NOT DETECTED NOT DETECTED Final   Respiratory Syncytial Virus NOT DETECTED NOT DETECTED Final   Bordetella pertussis NOT DETECTED NOT DETECTED Final   Chlamydophila pneumoniae NOT DETECTED NOT DETECTED Final   Mycoplasma pneumoniae NOT DETECTED NOT DETECTED Final    Comment: Performed at [facility], [address] [place], [place] [address]     Scheduled Meds: . atorvastatin  80 mg Oral q1800  . bisacodyl  10 mg Rectal Once  . buPROPion  300 mg Oral Daily  . heparin  5,000 Units Subcutaneous Q8H  . mometasone-formoterol  2 puff Inhalation BID  . polyethylene glycol  17 g Oral Daily  . senna-docusate  2 tablet Oral BID  . sodium chloride flush  3 mL Intravenous Q12H   Continuous Infusions: . sodium chloride    . 0.9 % NaCl with KCl 20 mEq / L 100 mL/hr at 02/19/19 1503    Procedures/Studies: Ct Head Wo Contrast  Result  Date: 02/18/2019 CLINICAL DATA:  73 y/o F; altered mental status as well as 1 day history of fever, nausea, and vomiting. EXAM: CT HEAD WITHOUT CONTRAST TECHNIQUE: Contiguous axial images were obtained from the base of the skull through the vertex without intravenous contrast. COMPARISON:  09/10/2016 CT head. FINDINGS: Brain: No evidence of acute infarction, hemorrhage, hydrocephalus, extra-axial collection or mass lesion/mass effect. Stable  nonspecific white matter hypodensities compatible with chronic microvascular ischemic changes. Stable volume loss of the brain. Vascular: Calcific atherosclerosis of internal carotid arteries and vertebral arteries. No hyperdense vessel. Skull: Normal. Negative for fracture or focal lesion. Sinuses/Orbits: Mucosal thickening within the left-sided ethmoid air cells. Additional paranasal sinuses and the mastoid air cells are normally aerated. Other: None. IMPRESSION: 1. No acute intracranial abnormality identified. 2. Stable chronic microvascular ischemic changes and parenchymal volume loss of the brain. Electronically Signed   By: Kristine Garbe M.D.   On: 02/18/2019 20:37   Ct Abdomen Pelvis W Contrast  Result Date: 02/16/2019 CLINICAL DATA:  Abdominal pain and nausea and vomiting beginning yesterday. Fever. EXAM: CT ABDOMEN AND PELVIS WITH CONTRAST TECHNIQUE: Multidetector CT imaging of the abdomen and pelvis was performed using the standard protocol following bolus administration of intravenous contrast. CONTRAST:  174mL OMNIPAQUE IOHEXOL 300 MG/ML  SOLN COMPARISON:  None. FINDINGS: Lower Chest: No acute findings. Hepatobiliary: No hepatic masses identified. Gallbladder is unremarkable. Pancreas:  No mass or inflammatory changes. Spleen: Within normal limits in size and appearance. Adrenals/Urinary Tract: No masses identified. No evidence of ureteral calculi or hydronephrosis. The urinary bladder is markedly distended but otherwise unremarkable in appearance.  Stomach/Bowel: No evidence of obstruction, inflammatory process or abnormal fluid collections. Normal appendix visualized. Vascular/Lymphatic: No pathologically enlarged lymph nodes. No abdominal aortic aneurysm. Aortic atherosclerosis. Reproductive: Prior hysterectomy noted. Adnexal regions are unremarkable in appearance. Other:  None. Musculoskeletal:  No suspicious bone lesions identified. IMPRESSION: Markedly distended urinary bladder. Recommend clinical correlation for urinary retention. No other acute findings. Aortic Atherosclerosis (ICD10-I70.0). Electronically Signed   By: Earle Gell M.D.   On: 02/16/2019 16:38   Dg Chest Port 1 View  Result Date: 02/16/2019 CLINICAL DATA:  Fever and weakness today. EXAM: PORTABLE CHEST 1 VIEW COMPARISON:  Single-view of the chest 09/10/2016. PA and lateral chest 05/15/2016. FINDINGS: The lungs are clear. Heart size is normal. No pneumothorax or pleural fluid. No acute or focal bony abnormality. IMPRESSION: No acute disease. Electronically Signed   By: Inge Rise M.D.   On: 02/16/2019 12:04    Orson Eva, DO  [facility] Pager [phone]  If 7PM-7AM, please contact night-coverage [email or web address] Password [email or web address] 02/19/2019, 5:07 PM   LOS: 2 days

## 2019-02-19 NOTE — Care Management Important Message (Signed)
Important Message  Patient Details  Name: Samantha Carey MRN: [id number] Date of Birth: 1946-02-19   Medicare Important Message Given:  Yes     Tommy Medal 02/19/2019, 2:58 PM

## 2019-02-20 LAB — CULTURE, BLOOD (ROUTINE X 2): Special Requests: ADEQUATE

## 2019-02-20 MED ORDER — PROCHLORPERAZINE EDISYLATE 10 MG/2ML IJ SOLN
10.0000 mg | Freq: Four times a day (QID) | INTRAMUSCULAR | Status: DC | PRN
  Administered 2019-02-20: 02:00:00 10 mg via INTRAVENOUS
  Filled 2019-02-20: qty 2

## 2019-02-20 NOTE — Progress Notes (Signed)
Patient signing out AMA, form in chart. Dr. Carles Collet discussed risks with patient. Husband notified.

## 2019-02-20 NOTE — Progress Notes (Signed)
Patient has had multiple loose bowel movements since beginning of shift.  Patient c/o not eating, gave patient soda and ginger ale to eat, patient threw up in trash can a few minutes after attempting to eat.  Patient also intermittently confused, patient hallucinating about men talking to her and "bug eyed things in room."  Patient husband called and spoke to him about patient condition and confusion at times.  Patient stated he believed the confusion came from being in the hospital.  Patient was able to sip a little water, that being it.  Will continue to monitor patient.

## 2019-02-20 NOTE — Discharge Summary (Signed)
Physician Discharge Summary  Samantha Carey WUJ:[id number] DOB: October 02, 1945 DOA: 02/16/2019  PCP: Adolph Pollack, MD  Admit date: 02/16/2019 Discharge date: 02/20/2019  Admitted From: Home Disposition:  AGAINST MEDICAL ADVICE     Discharge Condition: AGAINST MEDICAL ADVICE CODE STATUS: FULL    Brief/Interim Summary: 73 year old female with a history of COPD, chronic pain syndrome, hypertension, hyperlipidemia presenting with 1 day history of fevers and associated nausea and vomiting. She is a difficult historian, but ultimately is able to provide her clinical history. She denies any recent travels or sick contacts. She denies any recent headache, neck pain, chest pain, shortness breath, coughing, hemoptysis, diarrhea, abdominal pain, dysuria, hematuria. She denies any arthralgias or anosmia. Upon presentation, the patient was noted to have temperature of 101.0 F with WBC 16.2 and lactic acid 2.3. She was started on vancomycin and cefepime, and metronidazole. She was started on IV fluids. CT of the abdomen and pelvis on 02/16/2019 was negative for any acute findings except for distended urinary bladder.  Once the patient's cultures returned, and did not reveal anything clinically significant with regard to infection, the patient's antibiotics were stopped and the patient was monitored clinically.  She remained afebrile hemodynamically stable off antibiotics.  Her opioid dosing was decreased during hospitalization with improvement of her mental status.  I discussed with the patient and spouse that they need to follow-up with pain management and discuss reducing doses of her opioids.  On the morning of 02/20/2019, the patient had emesis with eating.  Otherwise, she remains afebrile hemodynamically stable.  She was adamant that she wanted to leave the hospital.  I discussed with the patient but this was not a good idea with the risks including but not limited to recurrent vomiting, dehydration, renal  failure.  The patient expressed understanding of the risks, and was adamant about leaving [place]. In speaking with [name], [name] has demonstrated the ability to understand his medical condition(s) which include recurrent vomiting, dehydration, renal failure.  [name] has demonstrated the ability to appreciate how treatment for recurrent vomiting, dehydration, renal failure will be beneficial.   [name] has also demonstrated the ability to understand and appreciate how refusal of treatement for recurrent vomiting, dehydration, renal failure could result in harm, repeat hospitalization, and possibly death.  Tailor Lucking demonstrates the ability to reason through the risks and benefits of the proposed treatment.  Finally, [name] is able to clearly communicate his/her choice.  She is A&O x 3   Discharge Diagnoses:   Sepsis--undifferentiated -present on admission -presented with fever, leukocytosis and elevated lactic acid -COVID NAA--neg -lactic acid peaked 2.3 -follow blood culture--neg -UA--neg for pyuria -personally reviewed CXR--no infiltrates -continue vancomycin -check [id number]--6.96>>2.95>>2.84  Acute metabolic/toxic encephalopathy -02/18/19 pt lethargic and confused -CBG 102, vitals stable -some improvement after narcan -decrease opioids and benzos-->improved mentation -02/20/19--mentation improved; A&O x3.  Spouse states pt is near baseline -ABG--7.44/28/82/22 on RA -B12--265 -TSH--0.453 -folate--11.4 -ammonia--14 -CT brain--neg -EEG--pt refused despite discussing risk, benefits, alternatives  Bacteremia -CoNS in one of 2 sets -likely contaminant -d/c vanco -she remained afebrile hemodynamically stable off antibiotics x24 hours  Malar Exanthem -viral exanthem vs red man syndrome  -viral respiratory panel--neg -repeat COVID--neg -developed after fever developed -improving now  Chronic Pain Syndrome -PMP AWARE--queried--no red  flags -she has chronic back pain--on home oxycodone 20 mg q 6 and alprazolam -decreasedose oxycodone and alprazolam as discussed above -continue gabapentin -May need to consider decreasing home doses of opioids and hypnotic medications.  I spoke with the patient and husband that they need to follow-up with pain management  Nausea and Vomiting -likely due to gastritis -resolved -lipase 21 -02/16/19--CT abd--neg for acute findings -A.m. 02/20/2019--patient had abdomen emesis after eating--as discussed above, the patient did not want to stay in the hospital for further treatment or diagnostic test.  She expressed understanding of the risks involved including dehydration, renal failure, hypotension.  She signed out AGAINST MEDICAL ADVICE  Depression and Anxiety -continue buproprion, alprazolam, trazodone  Hyperlipidemia -continue statin  COPD -stable on RA -continue Dulera  Essential Hypertension -holding Maxzide during the hospitalization  Hypokalemia/Hypomagnesemia -replete -check mag--2.1   Discharge Instructions   Allergies as of 02/20/2019      Reactions   Morphine And Related Nausea And Vomiting      Medication List    TAKE these medications   ALPRAZolam 0.5 MG tablet Commonly known as: XANAX Take 0.5 mg by mouth 4 (four) times daily.   atorvastatin 80 MG tablet Commonly known as: LIPITOR Take 80 mg by mouth daily.   buPROPion 300 MG 24 hr tablet Commonly known as: WELLBUTRIN XL Take 300 mg by mouth daily.   gabapentin 800 MG tablet Commonly known as: NEURONTIN Take 800 mg by mouth 4 (four) times daily.   ibuprofen 800 MG tablet Commonly known as: ADVIL Take 1 tablet (800 mg total) by mouth every 8 (eight) hours as needed.   Oxycodone HCl 20 MG Tabs Take 20 mg by mouth 4 (four) times daily.   Symbicort 160-4.5 MCG/ACT inhaler Generic drug: budesonide-formoterol Inhale 2 puffs into the lungs daily as needed (for shortness of breath).    traZODone 150 MG tablet Commonly known as: DESYREL Take 150 mg by mouth at bedtime as needed for sleep.   triamterene-hydrochlorothiazide 75-50 MG tablet Commonly known as: MAXZIDE Take 1 tablet by mouth daily.       Allergies  Allergen Reactions  . Morphine And Related Nausea And Vomiting    Consultations:  none   Procedures/Studies: Ct Head Wo Contrast  Result Date: 02/18/2019 CLINICAL DATA:  73 y/o F; altered mental status as well as 1 day history of fever, nausea, and vomiting. EXAM: CT HEAD WITHOUT CONTRAST TECHNIQUE: Contiguous axial images were obtained from the base of the skull through the vertex without intravenous contrast. COMPARISON:  09/10/2016 CT head. FINDINGS: Brain: No evidence of acute infarction, hemorrhage, hydrocephalus, extra-axial collection or mass lesion/mass effect. Stable nonspecific white matter hypodensities compatible with chronic microvascular ischemic changes. Stable volume loss of the brain. Vascular: Calcific atherosclerosis of internal carotid arteries and vertebral arteries. No hyperdense vessel. Skull: Normal. Negative for fracture or focal lesion. Sinuses/Orbits: Mucosal thickening within the left-sided ethmoid air cells. Additional paranasal sinuses and the mastoid air cells are normally aerated. Other: None. IMPRESSION: 1. No acute intracranial abnormality identified. 2. Stable chronic microvascular ischemic changes and parenchymal volume loss of the brain. Electronically Signed   By: Mitzi Hansen M.D.   On: 02/18/2019 20:37   Ct Abdomen Pelvis W Contrast  Result Date: 02/16/2019 CLINICAL DATA:  Abdominal pain and nausea and vomiting beginning yesterday. Fever. EXAM: CT ABDOMEN AND PELVIS WITH CONTRAST TECHNIQUE: Multidetector CT imaging of the abdomen and pelvis was performed using the standard protocol following bolus administration of intravenous contrast. CONTRAST:  OMNIPAQUE IOHEXOL 300 MG/ML  SOLN COMPARISON:  None.  FINDINGS: Lower Chest: No acute findings. Hepatobiliary: No hepatic masses identified. Gallbladder is unremarkable. Pancreas:  No mass or inflammatory changes. Spleen: Within normal limits in  size and appearance. Adrenals/Urinary Tract: No masses identified. No evidence of ureteral calculi or hydronephrosis. The urinary bladder is markedly distended but otherwise unremarkable in appearance. Stomach/Bowel: No evidence of obstruction, inflammatory process or abnormal fluid collections. Normal appendix visualized. Vascular/Lymphatic: No pathologically enlarged lymph nodes. No abdominal aortic aneurysm. Aortic atherosclerosis. Reproductive: Prior hysterectomy noted. Adnexal regions are unremarkable in appearance. Other:  None. Musculoskeletal:  No suspicious bone lesions identified. IMPRESSION: Markedly distended urinary bladder. Recommend clinical correlation for urinary retention. No other acute findings. Aortic Atherosclerosis (ICD10-I70.0). Electronically Signed   By: Myles RosenthalJohn  Stahl M.D.   On: 02/16/2019 16:38   Dg Chest Port 1 View  Result Date: 02/16/2019 CLINICAL DATA:  Fever and weakness today. EXAM: PORTABLE CHEST 1 VIEW COMPARISON:  Single-view of the chest 09/10/2016. PA and lateral chest 05/15/2016. FINDINGS: The lungs are clear. Heart size is normal. No pneumothorax or pleural fluid. No acute or focal bony abnormality. IMPRESSION: No acute disease. Electronically Signed   By: Drusilla Kannerhomas  Dalessio M.D.   On: 02/16/2019 12:04         Discharge Exam: Vitals:   02/20/19 0504 02/20/19 0800  BP: (!) 158/77   Pulse: 75   Resp: 20   Temp: 98 F (36.7 C)   SpO2: 99% 96%   Vitals:   02/19/19 1837 02/19/19 2107 02/20/19 0504 02/20/19 0800  BP:  (!) 154/59 (!) 158/77   Pulse:  65 75   Resp:  20 20   Temp:  (!) 97.5 F (36.4 C) 98 F (36.7 C)   TempSrc:  Oral Oral   SpO2: 96% 97% 99% 96%  Weight:      Height:        General: Pt is alert, awake, not in acute distress Cardiovascular: RRR,  S1/S2 +, no rubs, no gallops Respiratory: Fine bibasilar crackles but no wheezing Abdominal: Soft, NT, ND, bowel sounds + Extremities: no edema, no cyanosis   The results of significant diagnostics from this hospitalization (including imaging, microbiology, ancillary and laboratory) are listed below for reference.    Significant Diagnostic Studies: Ct Head Wo Contrast  Result Date: 02/18/2019 CLINICAL DATA:  73 y/o F; altered mental status as well as 1 day history of fever, nausea, and vomiting. EXAM: CT HEAD WITHOUT CONTRAST TECHNIQUE: Contiguous axial images were obtained from the base of the skull through the vertex without intravenous contrast. COMPARISON:  09/10/2016 CT head. FINDINGS: Brain: No evidence of acute infarction, hemorrhage, hydrocephalus, extra-axial collection or mass lesion/mass effect. Stable nonspecific white matter hypodensities compatible with chronic microvascular ischemic changes. Stable volume loss of the brain. Vascular: Calcific atherosclerosis of internal carotid arteries and vertebral arteries. No hyperdense vessel. Skull: Normal. Negative for fracture or focal lesion. Sinuses/Orbits: Mucosal thickening within the left-sided ethmoid air cells. Additional paranasal sinuses and the mastoid air cells are normally aerated. Other: None. IMPRESSION: 1. No acute intracranial abnormality identified. 2. Stable chronic microvascular ischemic changes and parenchymal volume loss of the brain. Electronically Signed   By: Mitzi HansenLance  Furusawa-Stratton M.D.   On: 02/18/2019 20:37   Ct Abdomen Pelvis W Contrast  Result Date: 02/16/2019 CLINICAL DATA:  Abdominal pain and nausea and vomiting beginning yesterday. Fever. EXAM: CT ABDOMEN AND PELVIS WITH CONTRAST TECHNIQUE: Multidetector CT imaging of the abdomen and pelvis was performed using the standard protocol following bolus administration of intravenous contrast. CONTRAST:  100mL OMNIPAQUE IOHEXOL 300 MG/ML  SOLN COMPARISON:  None.  FINDINGS: Lower Chest: No acute findings. Hepatobiliary: No hepatic masses identified. Gallbladder is unremarkable. Pancreas:  No mass or inflammatory changes. Spleen: Within normal limits in size and appearance. Adrenals/Urinary Tract: No masses identified. No evidence of ureteral calculi or hydronephrosis. The urinary bladder is markedly distended but otherwise unremarkable in appearance. Stomach/Bowel: No evidence of obstruction, inflammatory process or abnormal fluid collections. Normal appendix visualized. Vascular/Lymphatic: No pathologically enlarged lymph nodes. No abdominal aortic aneurysm. Aortic atherosclerosis. Reproductive: Prior hysterectomy noted. Adnexal regions are unremarkable in appearance. Other:  None. Musculoskeletal:  No suspicious bone lesions identified. IMPRESSION: Markedly distended urinary bladder. Recommend clinical correlation for urinary retention. No other acute findings. Aortic Atherosclerosis (ICD10-I70.0). Electronically Signed   By: Myles RosenthalJohn  Stahl M.D.   On: 02/16/2019 16:38   Dg Chest Port 1 View  Result Date: 02/16/2019 CLINICAL DATA:  Fever and weakness today. EXAM: PORTABLE CHEST 1 VIEW COMPARISON:  Single-view of the chest 09/10/2016. PA and lateral chest 05/15/2016. FINDINGS: The lungs are clear. Heart size is normal. No pneumothorax or pleural fluid. No acute or focal bony abnormality. IMPRESSION: No acute disease. Electronically Signed   By: Drusilla Kannerhomas  Dalessio M.D.   On: 02/16/2019 12:04     Microbiology: Recent Results (from the past 240 hour(s))  Urine culture     Status: Abnormal   Collection Time: 02/16/19 12:24 PM   Specimen: Urine, Random  Result Value Ref Range Status   Specimen Description   Final    URINE, RANDOM Performed at [facility], [address] [place], [place] [address]    Special Requests   Final    NONE Performed at [facility], [address] [place], [place] [address]    Culture MULTIPLE SPECIES PRESENT, SUGGEST RECOLLECTION (A)   Final   Report Status 02/17/2019 FINAL  Final  SARS Coronavirus 2 (CEPHEID- Performed in [facility] hospital lab), Hosp Order     Status: None   Collection Time: 02/16/19 12:26 PM   Specimen: Nasopharyngeal Swab  Result Value Ref Range Status   SARS Coronavirus 2 NEGATIVE NEGATIVE Final    Comment: (NOTE) If result is NEGATIVE SARS-CoV-2 target nucleic acids are NOT DETECTED. The SARS-CoV-2 RNA is generally detectable in upper and lower  respiratory specimens during the acute phase of infection. The lowest  concentration of SARS-CoV-2 viral copies this assay can detect is 250  copies / mL. A negative result does not preclude SARS-CoV-2 infection  and should not be used as the sole basis for treatment or other  patient management decisions.  A negative result may occur with  improper specimen collection / handling, submission of specimen other  than nasopharyngeal swab, presence of viral mutation(s) within the  areas targeted by this assay, and inadequate number of viral copies  (<250 copies / mL). A negative result must be combined with clinical  observations, patient history, and epidemiological information. If result is POSITIVE SARS-CoV-2 target nucleic acids are DETECTED. The SARS-CoV-2 RNA is generally detectable in upper and lower  respiratory specimens dur ing the acute phase of infection.  Positive  results are indicative of active infection with SARS-CoV-2.  Clinical  correlation with patient history and other diagnostic information is  necessary to determine patient infection status.  Positive results do  not rule out bacterial infection or co-infection with other viruses. If result is PRESUMPTIVE POSTIVE SARS-CoV-2 nucleic acids MAY BE PRESENT.   A presumptive positive result was obtained on the submitted specimen  and confirmed on repeat testing.  While 2019 novel coronavirus  (SARS-CoV-2) nucleic acids may be present in the submitted sample  additional confirmatory  testing may be necessary for epidemiological  and / or clinical management purposes  to differentiate between  SARS-CoV-2 and other Sarbecovirus currently known to infect humans.  If clinically indicated additional testing with an alternate test  methodology [phone]) is advised. The SARS-CoV-2 RNA is generally  detectable in upper and lower respiratory sp ecimens during the acute  phase of infection. The expected result is Negative. Fact Sheet for Patients:  BoilerBrush.com.cy Fact Sheet for Healthcare Providers: https://pope.com/ This test is not yet approved or cleared by the [place] FDA and has been authorized for detection and/or diagnosis of SARS-CoV-2 by FDA under an Emergency Use Authorization (EUA).  This EUA will remain in effect (meaning this test can be used) for the duration of the COVID-19 declaration under Section 564(b)(1) of the Act, 21 U.S.C. section 360bbb-3(b)(1), unless the authorization is terminated or revoked sooner. Performed at [facility], [address], [place], [place] [address]   Blood Culture (routine x 2)     Status: None (Preliminary result)   Collection Time: 02/16/19 12:43 PM   Specimen: BLOOD RIGHT HAND  Result Value Ref Range Status   Specimen Description BLOOD RIGHT HAND  Final   Special Requests   Final    BOTTLES DRAWN AEROBIC AND ANAEROBIC Blood Culture adequate volume   Culture   Final    NO GROWTH 4 DAYS Performed at [facility], [address], [place], [place] [address]    Report Status PENDING  Incomplete  Blood Culture (routine x 2)     Status: Abnormal   Collection Time: 02/16/19 12:47 PM   Specimen: BLOOD LEFT ARM  Result Value Ref Range Status   Specimen Description   Final    BLOOD LEFT ARM Performed at [facility], [address], [place], [place] [address]    Special Requests   Final    BOTTLES DRAWN AEROBIC AND ANAEROBIC Blood Culture adequate volume Performed at  [facility], [address], [place], [place] [address]    Culture  Setup Time   Final    GRAM POSITIVE COCCI BOTH AEROBIC AND ANAEROBIC BOTTLES Gram Stain Report Called to,Read Back By and Verified With: BENSON D. AT [id number] ON [id number] BY THOMPSON S. CRITICAL RESULT CALLED TO, READ BACK BY AND VERIFIED WITH: PHARMD G COFFEE [id number] AT 1405 BY CM    Culture (A)  Final    STAPHYLOCOCCUS SPECIES (COAGULASE NEGATIVE) THE SIGNIFICANCE OF ISOLATING THIS ORGANISM FROM A SINGLE SET OF BLOOD CULTURES WHEN MULTIPLE SETS ARE DRAWN IS UNCERTAIN. PLEASE NOTIFY THE MICROBIOLOGY DEPARTMENT WITHIN ONE WEEK IF SPECIATION AND SENSITIVITIES ARE REQUIRED. Performed at [facility], [address] [place], [place] [address]    Report Status 02/20/2019 FINAL  Final  Blood Culture ID Panel (Reflexed)     Status: Abnormal   Collection Time: 02/16/19 12:47 PM  Result Value Ref Range Status   Enterococcus species NOT DETECTED NOT DETECTED Final   Listeria monocytogenes NOT DETECTED NOT DETECTED Final   Staphylococcus species DETECTED (A) NOT DETECTED Final    Comment: Methicillin (oxacillin) resistant coagulase negative staphylococcus. Possible blood culture contaminant (unless isolated from more than one blood culture draw or clinical case suggests pathogenicity). No antibiotic treatment is indicated for blood  culture contaminants. CRITICAL RESULT CALLED TO, READ BACK BY AND VERIFIED WITH: PHARMD G COFFEE [id number] AT 1405 BY CM    Staphylococcus aureus (BCID) NOT DETECTED NOT DETECTED Final   Methicillin resistance DETECTED (A) NOT DETECTED Final  Comment: CRITICAL RESULT CALLED TO, READ BACK BY AND VERIFIED WITH: PHARMD G COFFEE [id number] AT 1405 BY CM    Streptococcus species NOT DETECTED NOT DETECTED Final   Streptococcus agalactiae NOT DETECTED NOT DETECTED Final   Streptococcus pneumoniae NOT DETECTED NOT DETECTED Final   Streptococcus pyogenes NOT DETECTED NOT DETECTED Final   Acinetobacter  baumannii NOT DETECTED NOT DETECTED Final   Enterobacteriaceae species NOT DETECTED NOT DETECTED Final   Enterobacter cloacae complex NOT DETECTED NOT DETECTED Final   Escherichia coli NOT DETECTED NOT DETECTED Final   Klebsiella oxytoca NOT DETECTED NOT DETECTED Final   Klebsiella pneumoniae NOT DETECTED NOT DETECTED Final   Proteus species NOT DETECTED NOT DETECTED Final   Serratia marcescens NOT DETECTED NOT DETECTED Final   Haemophilus influenzae NOT DETECTED NOT DETECTED Final   Neisseria meningitidis NOT DETECTED NOT DETECTED Final   Pseudomonas aeruginosa NOT DETECTED NOT DETECTED Final   Candida albicans NOT DETECTED NOT DETECTED Final   Candida glabrata NOT DETECTED NOT DETECTED Final   Candida krusei NOT DETECTED NOT DETECTED Final   Candida parapsilosis NOT DETECTED NOT DETECTED Final   Candida tropicalis NOT DETECTED NOT DETECTED Final    Comment: Performed at [facility], [address] [place], [place] [address]  SARS Coronavirus 2 (CEPHEID - Performed in [facility] hospital lab), Hosp Order     Status: None   Collection Time: 02/17/19 11:27 AM   Specimen: Nasopharyngeal Swab  Result Value Ref Range Status   SARS Coronavirus 2 NEGATIVE NEGATIVE Final    Comment: (NOTE) If result is NEGATIVE SARS-CoV-2 target nucleic acids are NOT DETECTED. The SARS-CoV-2 RNA is generally detectable in upper and lower  respiratory specimens during the acute phase of infection. The lowest  concentration of SARS-CoV-2 viral copies this assay can detect is 250  copies / mL. A negative result does not preclude SARS-CoV-2 infection  and should not be used as the sole basis for treatment or other  patient management decisions.  A negative result may occur with  improper specimen collection / handling, submission of specimen other  than nasopharyngeal swab, presence of viral mutation(s) within the  areas targeted by this assay, and inadequate number of viral copies  (<250 copies /  mL). A negative result must be combined with clinical  observations, patient history, and epidemiological information. If result is POSITIVE SARS-CoV-2 target nucleic acids are DETECTED. The SARS-CoV-2 RNA is generally detectable in upper and lower  respiratory specimens dur ing the acute phase of infection.  Positive  results are indicative of active infection with SARS-CoV-2.  Clinical  correlation with patient history and other diagnostic information is  necessary to determine patient infection status.  Positive results do  not rule out bacterial infection or co-infection with other viruses. If result is PRESUMPTIVE POSTIVE SARS-CoV-2 nucleic acids MAY BE PRESENT.   A presumptive positive result was obtained on the submitted specimen  and confirmed on repeat testing.  While 2019 novel coronavirus  (SARS-CoV-2) nucleic acids may be present in the submitted sample  additional confirmatory testing may be necessary for epidemiological  and / or clinical management purposes  to differentiate between  SARS-CoV-2 and other Sarbecovirus currently known to infect humans.  If clinically indicated additional testing with an alternate test  methodology [phone]) is advised. The SARS-CoV-2 RNA is generally  detectable in upper and lower respiratory sp ecimens during the acute  phase of infection. The expected result is Negative. Fact Sheet for Patients:  BoilerBrush.com.cy Fact Sheet for Healthcare Providers: https://pope.com/ This test is not yet approved or cleared by the [place] FDA and has been authorized for detection and/or diagnosis of SARS-CoV-2 by FDA under an Emergency Use Authorization (EUA).  This EUA will remain in effect (meaning this test can be used) for the duration of the COVID-19 declaration under Section 564(b)(1) of the Act, 21 U.S.C. section 360bbb-3(b)(1), unless the authorization is terminated or revoked  sooner. Performed at [facility], [address] [place], [place] [address]   Respiratory Panel by PCR     Status: None   Collection Time: 02/17/19 10:05 PM   Specimen: Nasopharyngeal Swab; Respiratory  Result Value Ref Range Status   Adenovirus NOT DETECTED NOT DETECTED Final   Coronavirus 229E NOT DETECTED NOT DETECTED Final    Comment: (NOTE) The Coronavirus on the Respiratory Panel, DOES NOT test for the novel  Coronavirus (2019 nCoV)    Coronavirus HKU1 NOT DETECTED NOT DETECTED Final   Coronavirus NL63 NOT DETECTED NOT DETECTED Final   Coronavirus OC43 NOT DETECTED NOT DETECTED Final   Metapneumovirus NOT DETECTED NOT DETECTED Final   Rhinovirus / Enterovirus NOT DETECTED NOT DETECTED Final   Influenza A NOT DETECTED NOT DETECTED Final   Influenza B NOT DETECTED NOT DETECTED Final   Parainfluenza Virus 1 NOT DETECTED NOT DETECTED Final   Parainfluenza Virus 2 NOT DETECTED NOT DETECTED Final   Parainfluenza Virus 3 NOT DETECTED NOT DETECTED Final   Parainfluenza Virus 4 NOT DETECTED NOT DETECTED Final   Respiratory Syncytial Virus NOT DETECTED NOT DETECTED Final   Bordetella pertussis NOT DETECTED NOT DETECTED Final   Chlamydophila pneumoniae NOT DETECTED NOT DETECTED Final   Mycoplasma pneumoniae NOT DETECTED NOT DETECTED Final    Comment: Performed at [facility], [address] [place], [place] [address]     Labs: Basic Metabolic Panel: Recent Labs  Lab 02/16/19 1212 02/17/19 0514 02/18/19 0525 02/19/19 0538  NA 139 140 141 146*  K 4.0 3.2* 3.8 3.8  CL 105 110 114* 114*  CO2 22 22 21* 22  GLUCOSE 124* 109* 98 94  BUN CREATININE 0.84 0.65 0.63 0.63  CALCIUM 9.3 8.2* 8.2* 8.6*  MG  --   --  1.6* 2.1   Liver Function Tests: Recent Labs  Lab 02/16/19 1212 02/19/19 0538  AST 23 21  ALT 22 19  ALKPHOS 125 81  BILITOT 0.8 0.4  PROT 6.8 5.8*  ALBUMIN 3.8 3.2*   Recent Labs  Lab 02/16/19 1212  LIPASE 21   Recent Labs  Lab  02/18/19 1914  AMMONIA 14   CBC: Recent Labs  Lab 02/16/19 1212 02/17/19 0514 02/18/19 0525 02/19/19 0538  WBC 16.2* 8.8 5.7 5.0  NEUTROABS 13.8*  --  4.0  --   HGB 13.9 12.8 11.3* 10.9*  HCT 42.8 39.9 36.5 33.9*  MCV 88.4 90.5 91.9 91.1  PLT 241 254 220 220   Cardiac Enzymes: No results for input(s): CKTOTAL, CKMB, CKMBINDEX, TROPONINI in the last 168 hours. BNP: Invalid input(s): POCBNP CBG: Recent Labs  Lab 02/18/19 1602  GLUCAP 102*    Time coordinating discharge:  36 minutes  Signed:  Catarina Hartshorn, DO [facility] Pager: [phone] 02/20/2019, 1:01 PM

## 2019-02-21 LAB — CULTURE, BLOOD (ROUTINE X 2)
Culture: NO GROWTH
Special Requests: ADEQUATE

## 2019-07-24 IMAGING — CT CT HEAD WITHOUT CONTRAST
3 series · 16 of 47 positions shown, 19 images · non-contrast
Comparison: 09/10/2016 CT head.

CLINICAL DATA: 73 y/o F; altered mental status as well as 1 day
history of fever, nausea, and vomiting.

EXAM:
CT HEAD WITHOUT CONTRAST
TECHNIQUE: Contiguous axial images were obtained from the base of the skull
through the vertex without intravenous contrast.

[Series 2: head w o · axial · 0.44mm/px · z∈[-84,+51]mm · 10 of 33 slices shown, 13 images]
[im 3/33  brain]
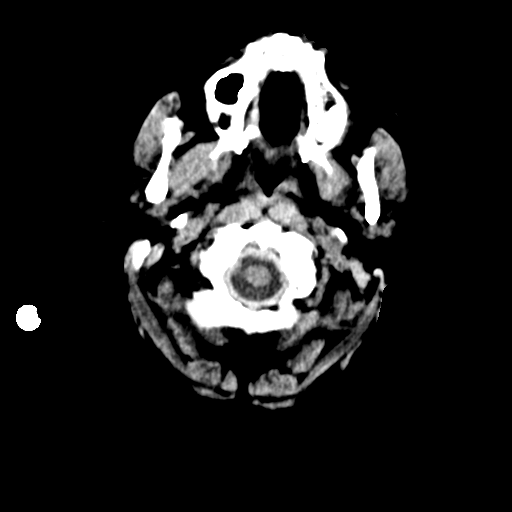
[im 3/33  bone]
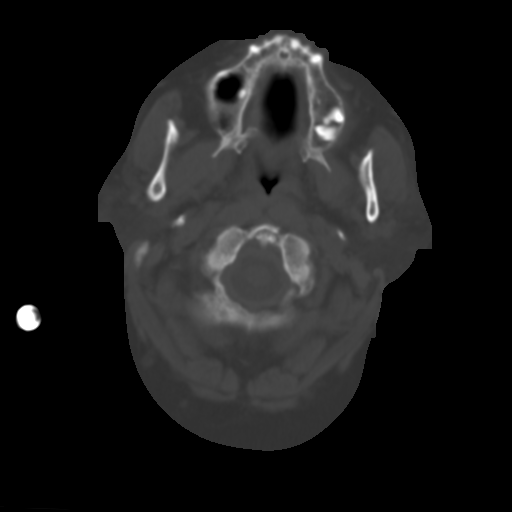
[im 6/33  brain]
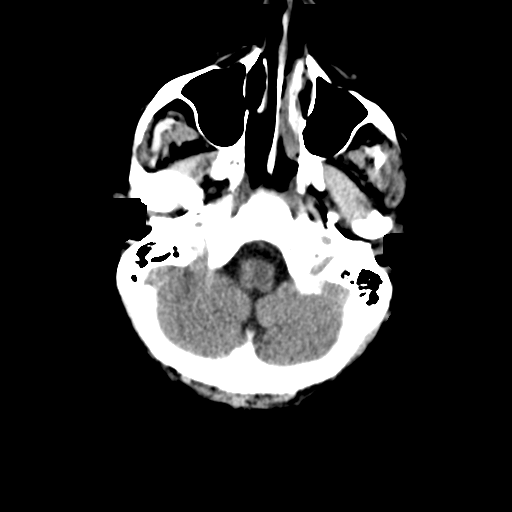
[im 9/33  brain]
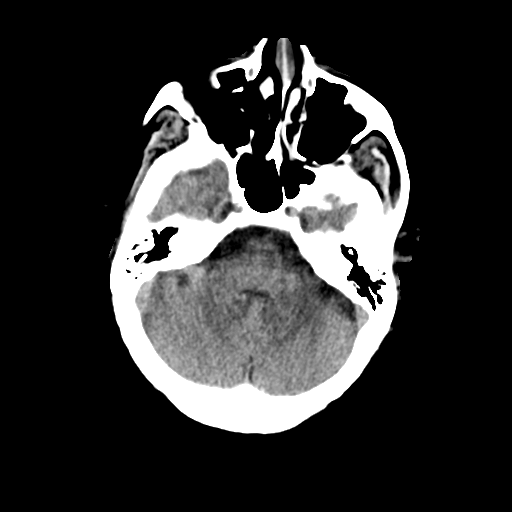
[im 12/33  brain]
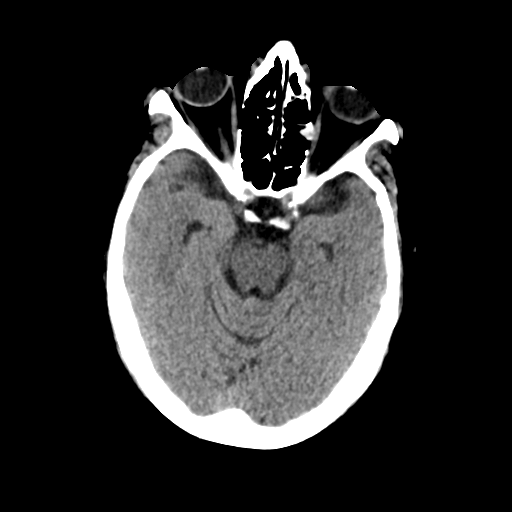
[im 15/33  brain]
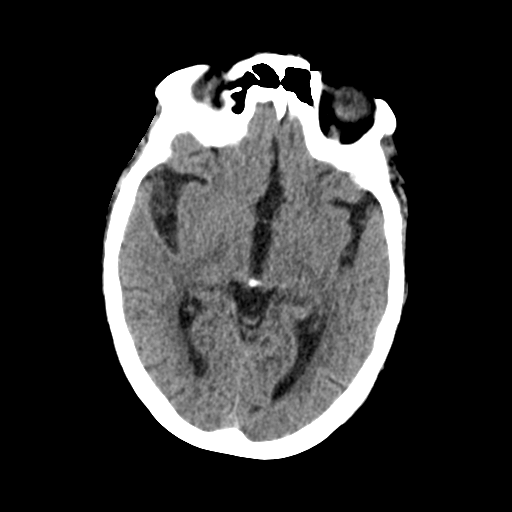
[im 15/33  bone]
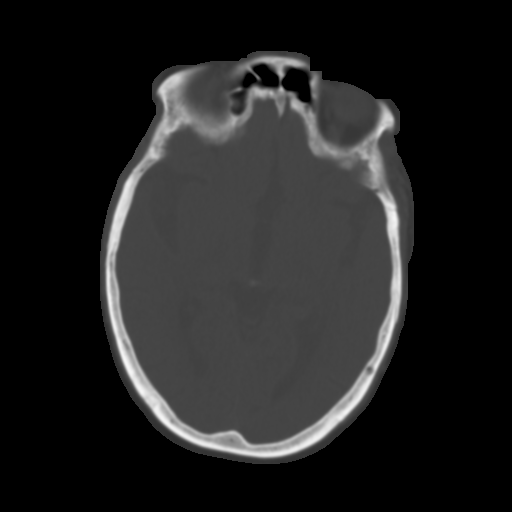
[im 18/33  brain]
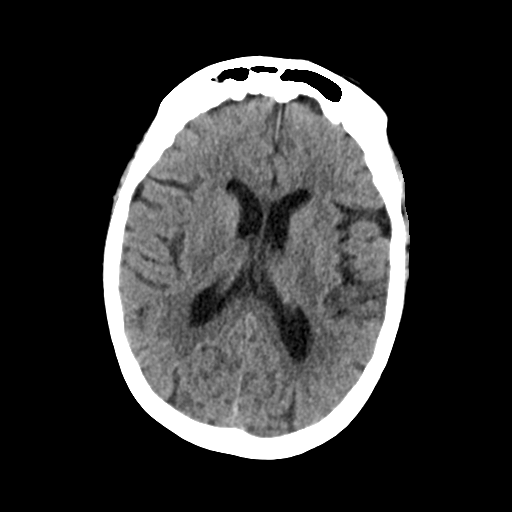
[im 21/33  brain]
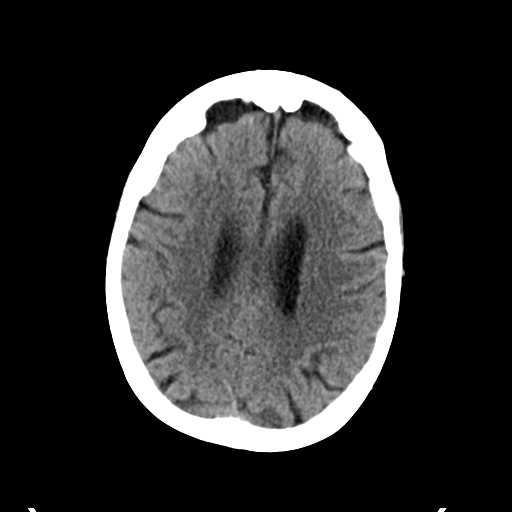
[im 25/33  brain]
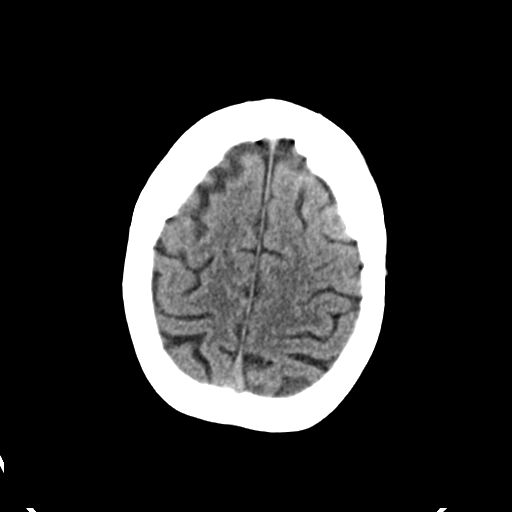
[im 27/33  brain]
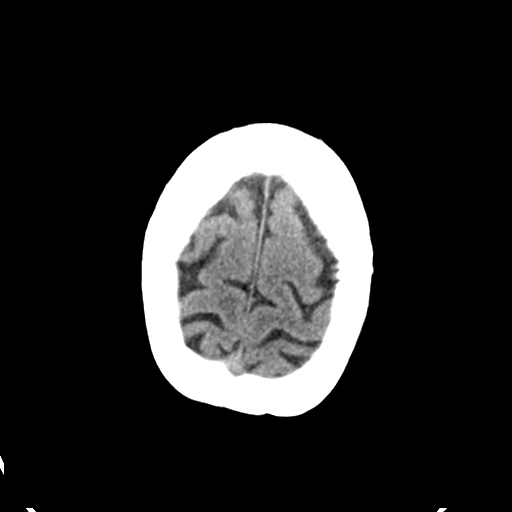
[im 27/33  bone]
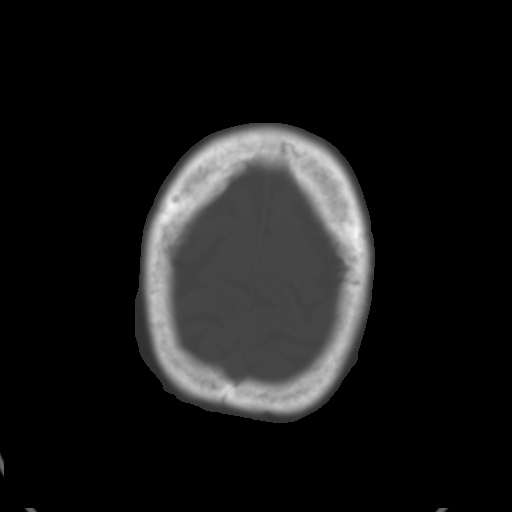
[im 30/33  brain]
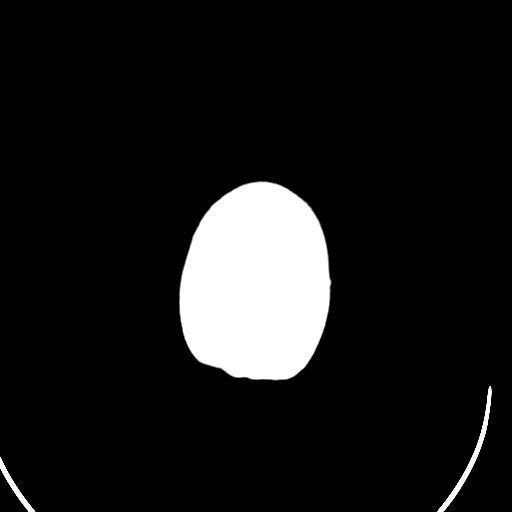

[Series 4: coronal soft · coronal · 0.36mm/px · 3 of 79 slices shown]
[im 27/79  brain]
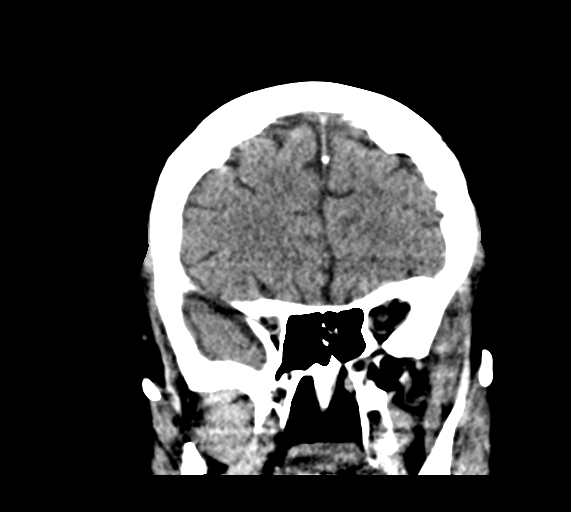
[im 35/79  brain]
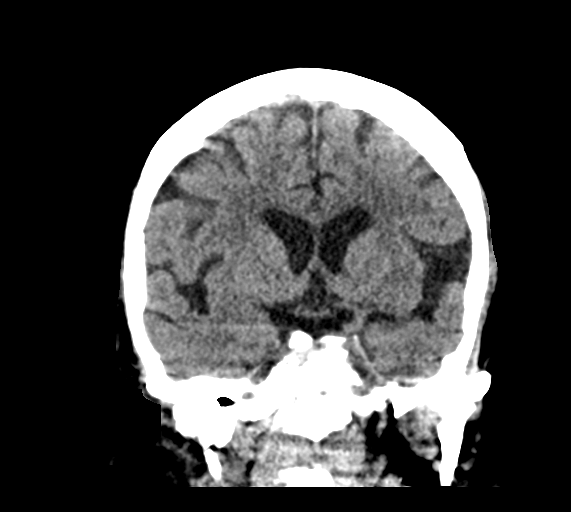
[im 44/79  brain]
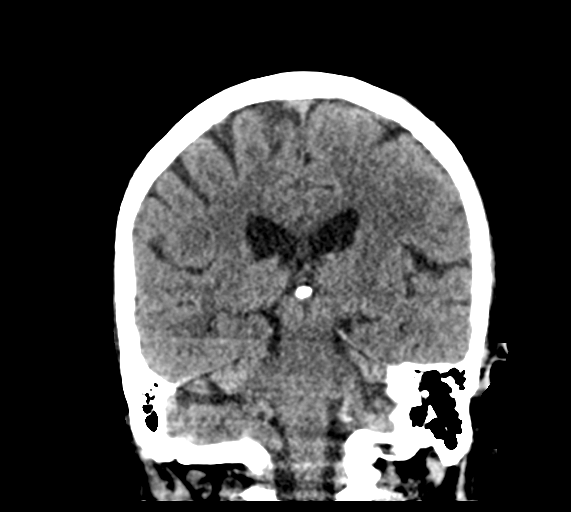

[Series 5: sagittal soft · sagittal · 0.37mm/px · 3 of 67 slices shown]
[im 23/67  brain]
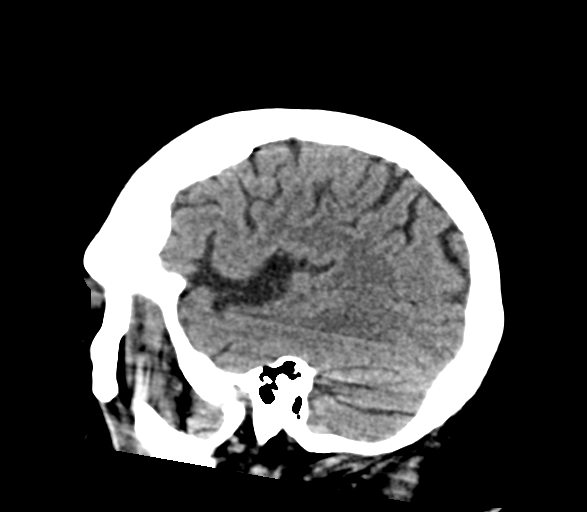
[im 34/67  brain]
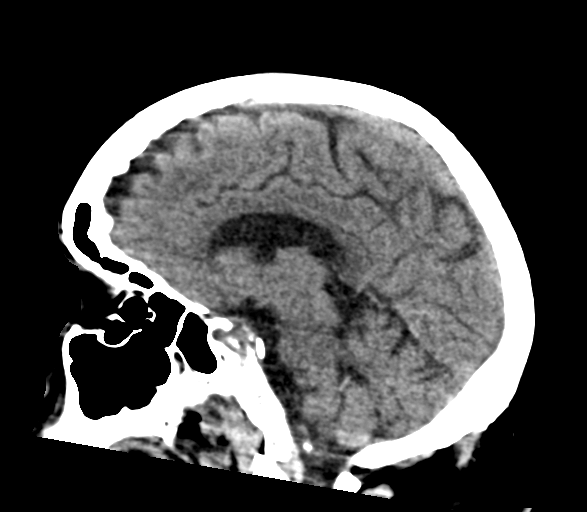
[im 45/67  brain]
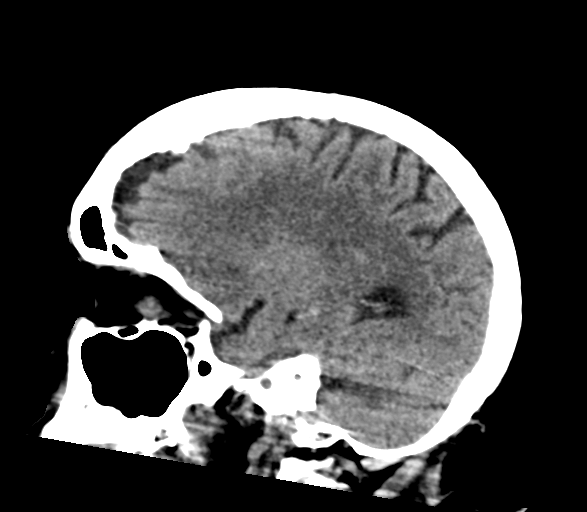

[16 of 47 positions shown; findings below may reference images not displayed]

FINDINGS: Brain: No evidence of acute infarction, hemorrhage, hydrocephalus,
extra-axial collection or mass lesion/mass effect. Stable
nonspecific white matter hypodensities compatible with chronic
microvascular ischemic changes. Stable volume loss of the brain.

Vascular: Calcific atherosclerosis of internal carotid arteries and
vertebral arteries. No hyperdense vessel.

Skull: Normal. Negative for fracture or focal lesion.

Sinuses/Orbits: Mucosal thickening within the left-sided ethmoid air
cells. Additional paranasal sinuses and the mastoid air cells are
normally aerated.

Other: None.
IMPRESSION: 1. No acute intracranial abnormality identified.
2. Stable chronic microvascular ischemic changes and parenchymal
volume loss of the brain.

## 2023-10-02 ENCOUNTER — Encounter: Payer: Self-pay | Admitting: Internal Medicine

## 2023-10-02 ENCOUNTER — Ambulatory Visit (INDEPENDENT_AMBULATORY_CARE_PROVIDER_SITE_OTHER): Payer: Medicare (Managed Care) | Admitting: Internal Medicine

## 2023-10-02 VITALS — BP 138/82 | HR 87 | Ht 59.0 in | Wt 142.6 lb

## 2023-10-02 DIAGNOSIS — J449 Chronic obstructive pulmonary disease, unspecified: Secondary | ICD-10-CM

## 2023-10-02 DIAGNOSIS — I10 Essential (primary) hypertension: Secondary | ICD-10-CM

## 2023-10-02 DIAGNOSIS — G894 Chronic pain syndrome: Secondary | ICD-10-CM | POA: Diagnosis not present

## 2023-10-02 DIAGNOSIS — R739 Hyperglycemia, unspecified: Secondary | ICD-10-CM

## 2023-10-02 DIAGNOSIS — M961 Postlaminectomy syndrome, not elsewhere classified: Secondary | ICD-10-CM | POA: Diagnosis not present

## 2023-10-02 DIAGNOSIS — Z1159 Encounter for screening for other viral diseases: Secondary | ICD-10-CM

## 2023-10-02 DIAGNOSIS — E782 Mixed hyperlipidemia: Secondary | ICD-10-CM | POA: Insufficient documentation

## 2023-10-02 DIAGNOSIS — Z780 Asymptomatic menopausal state: Secondary | ICD-10-CM

## 2023-10-02 MED ORDER — OXYCODONE-ACETAMINOPHEN 10-325 MG PO TABS: 1.0000 | ORAL_TABLET | Freq: Three times a day (TID) | ORAL | 0 refills | Status: DC | PRN

## 2023-10-02 MED ORDER — BUDESONIDE-FORMOTEROL FUMARATE 160-4.5 MCG/ACT IN AERO: 2.0000 | INHALATION_SPRAY | Freq: Every day | RESPIRATORY_TRACT | 5 refills | Status: DC | PRN

## 2023-10-02 MED ORDER — ALBUTEROL SULFATE HFA 108 (90 BASE) MCG/ACT IN AERS: 2.0000 | INHALATION_SPRAY | Freq: Four times a day (QID) | RESPIRATORY_TRACT | 3 refills | Status: AC | PRN

## 2023-10-02 MED ORDER — GABAPENTIN 800 MG PO TABS: 800.0000 mg | ORAL_TABLET | Freq: Three times a day (TID) | ORAL | 2 refills | Status: DC
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
## 2023-10-02 NOTE — Assessment & Plan Note (Signed)
Has a history of 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, s/p lumbar spine surgeries X 2 around 2019 Used to follow-up with [facility] On chronic opioids and gabapentin

## 2023-10-02 NOTE — Assessment & Plan Note (Signed)
On atorvastatin 80 mg QD ?

## 2023-10-02 NOTE — Assessment & Plan Note (Signed)
BP Readings from Last 1 Encounters:  10/02/23 138/82   Well-controlled with Maxzide 75-50 mg QD Counseled for compliance with the medications Advised DASH diet and moderate exercise/walking as tolerated
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
## 2023-10-02 NOTE — Assessment & Plan Note (Addendum)
Has a history of 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, s/p lumbar spine surgeries X 2 around 2019 Used to follow-up with [facility] Was on chronic opioids -oxycodone 20 mg 4 times daily, prescribed by her previous PCP Considering her severe pain and lack of mobility due to it, started Percocet 10-325 mg TID PRN for severe pain Decreased dose of gabapentin to 800 mg 3 times daily, used to take it 5 times daily Check urine tox assure Referred to [facility]

## 2023-10-02 NOTE — Patient Instructions (Addendum)
Please schedule bone density.  Please start taking medicines as prescribed.  Percocet 10-325 mg one tablet 3 times daily. Gabapentin 800 mg one tablet 3 times daily.  Do not take medicines more than how it is prescribed.  Please start using Symbicort twice daily and Albuterol as needed for shortness of breath or wheezing.

## 2023-10-03 LAB — CMP14+EGFR
ALT: 20 [IU]/L (ref 0–32)
AST: 23 [IU]/L (ref 0–40)
Albumin: 4.1 g/dL (ref 3.8–4.8)
Alkaline Phosphatase: 117 [IU]/L (ref 44–121)
BUN/Creatinine Ratio: 29 — ABNORMAL HIGH (ref 12–28)
BUN: 27 mg/dL (ref 8–27)
Bilirubin Total: <0.2 mg/dL (ref 0.0–1.2)
CO2: 21 mmol/L (ref 20–29)
Calcium: 9.2 mg/dL (ref 8.7–10.3)
Chloride: 106 mmol/L (ref 96–106)
Creatinine, Ser: 0.93 mg/dL (ref 0.57–1.00)
Globulin, Total: 2.3 g/dL (ref 1.5–4.5)
Glucose: 85 mg/dL (ref 70–99)
Potassium: 4.6 mmol/L (ref 3.5–5.2)
Sodium: 143 mmol/L (ref 134–144)
Total Protein: 6.4 g/dL (ref 6.0–8.5)
eGFR: 63 mL/min/{1.73_m2} (ref >59)

## 2023-10-03 LAB — HEPATITIS C ANTIBODY: Hep C Virus Ab: NONREACTIVE

## 2023-10-03 LAB — CBC WITH DIFFERENTIAL/PLATELET
Basophils Absolute: 0 10*3/uL (ref 0.0–0.2)
Basos: 1 %
EOS (ABSOLUTE): 0.6 10*3/uL — ABNORMAL HIGH (ref 0.0–0.4)
Eos: 8 %
Hematocrit: 38.9 % (ref 34.0–46.6)
Hemoglobin: 12.9 g/dL (ref 11.1–15.9)
Immature Grans (Abs): 0 10*3/uL (ref 0.0–0.1)
Immature Granulocytes: 0 %
Lymphocytes Absolute: 2.4 10*3/uL (ref 0.7–3.1)
Lymphs: 35 %
MCH: 29.3 pg (ref 26.6–33.0)
MCHC: 33.2 g/dL (ref 31.5–35.7)
MCV: 88 fL (ref 79–97)
Monocytes Absolute: 0.5 10*3/uL (ref 0.1–0.9)
Monocytes: 8 %
Neutrophils Absolute: 3.4 10*3/uL (ref 1.4–7.0)
Neutrophils: 48 %
Platelets: 309 10*3/uL (ref 150–450)
RBC: 4.4 x10E6/uL (ref 3.77–5.28)
RDW: 12.9 % (ref 11.7–15.4)
WBC: 7.1 10*3/uL (ref 3.4–10.8)

## 2023-10-03 LAB — HEMOGLOBIN A1C
Est. average glucose Bld gHb Est-mCnc: 123 mg/dL
Hgb A1c MFr Bld: 5.9 % — ABNORMAL HIGH (ref 4.8–5.6)

## 2023-10-06 ENCOUNTER — Telehealth: Payer: Self-pay

## 2023-10-06 NOTE — Telephone Encounter (Signed)
Spoke with spouse gave him with the phone number to call and schedule.

## 2023-10-06 NOTE — Telephone Encounter (Signed)
Copied from CRM [id number]. Topic: Clinical - Medical Advice >> Oct 06, 2023 10:23 AM Hector Shade B wrote: Reason for CRM: [id number] Samantha Carey husband of patient called requesting information on a pain clinic that the patient was supposed to be sent to

## 2023-10-06 NOTE — Telephone Encounter (Signed)
Referral sent to: [facility] [place] [phone]

## 2023-10-07 LAB — TOXASSURE SELECT 13 (MW), URINE

## 2023-10-09 ENCOUNTER — Ambulatory Visit: Payer: Self-pay | Admitting: Internal Medicine

## 2023-10-09 ENCOUNTER — Other Ambulatory Visit: Payer: Self-pay | Admitting: Internal Medicine

## 2023-10-09 DIAGNOSIS — M961 Postlaminectomy syndrome, not elsewhere classified: Secondary | ICD-10-CM

## 2023-10-09 DIAGNOSIS — G894 Chronic pain syndrome: Secondary | ICD-10-CM

## 2023-10-09 MED ORDER — OXYCODONE-ACETAMINOPHEN 10-325 MG PO TABS: 1.0000 | ORAL_TABLET | Freq: Three times a day (TID) | ORAL | 0 refills | Status: DC | PRN

## 2023-10-09 NOTE — Telephone Encounter (Signed)
 Copied from CRM [id number]. Topic: Clinical - Medication Refill >> Oct 09, 2023  3:00 PM Carlatta H wrote: Most Recent Primary Care Visit:  Provider: TOBIE SUZZANE POUR  Department: [facility]  Visit Type: NEW PATIENT  Date: 10/02/2023  Medication: oxyCODONE -acetaminophen  (PERCOCET) 10-325 MG tablet [[id number]]   Has the patient contacted their pharmacy? Yes (Agent: If no, request that the patient contact the pharmacy for the refill. If patient does not wish to contact the pharmacy document the reason why and proceed with request.) (Agent: If yes, when and what did the pharmacy advise?)  Is this the correct pharmacy for this prescription? Yes If no, delete pharmacy and type the correct one.  This is the patient's preferred pharmacy:  [id number] [place] [place] - [address] [place] [address] Phone: [phone] Fax: [phone]  [facility] [address] [place] [address] [place] - [address] [place] [address] Phone: [phone] Fax: [phone]   Has the prescription been filled recently? No  Is the patient out of the medication? Yes  Has the patient been seen for an appointment in the last year OR does the patient have an upcoming appointment? Yes  Can we respond through MyChart? No  Agent: Please be advised that Rx refills may take up to 3 business days. We ask that you follow-up with your pharmacy.

## 2023-10-09 NOTE — Telephone Encounter (Signed)
 Copied from CRM [id number]. Topic: Clinical - Red Word Triage >> Oct 09, 2023 10:36 AM Wess RAMAN wrote: Red Word that prompted transfer to Nurse Triage: SEVERE PAIN. Samantha Carey is experiencing severe pain from back surgery. Pain level is an 8 and currently out of oxyCODONE -acetaminophen  (PERCOCET) 10-325 MG tablet. Would like to know if a refill could be sent to pharmacy [id number].  Chief Complaint: back pain Symptoms: low back pain next to tailbone Frequency: worsening since November Pertinent Negatives: Patient denies numbness and tingling Disposition: [] ED /[] Urgent Care (no appt availability in office) / [] Appointment(In office/virtual)/ []  [facility]/ [] Home Care/ [] Refused Recommended Disposition /[] [facility]/ [x]  Follow-up with PCP Additional Notes: pt is out of prescribed pain medication since November and pain has been worsening.  Pt husband stated they have been trying to get an appointment with pain clinic - it has been unsuccessful. Has been made aware the earliest appt could be in March therefore requesting a refill to help with pain until able to get into pain clinic  Answer Assessment - Initial Assessment Questions 1. ONSET: When did the pain begin?      November and worsening 2. LOCATION: Where does it hurt? (upper, mid or lower back)     Lower back next to tailbone 3. SEVERITY: How bad is the pain?  (e.g., Scale 1-10; mild, moderate, or severe)   - MILD (1-3): Doesn't interfere with normal activities.    - MODERATE (4-7): Interferes with normal activities or awakens from sleep.    - SEVERE (8-10): Excruciating pain, unable to do any normal activities.      8 to 10/10 4. PATTERN: Is the pain constant? (e.g., yes, no; constant, intermittent)      constant 5. RADIATION: Does the pain shoot into your legs or somewhere else?     no 6. CAUSE:  What do you think is causing the back pain?      on 7. BACK OVERUSE:  Any recent lifting of heavy  objects, strenuous work or exercise?     no 8. MEDICINES: What have you taken so far for the pain? (e.g., nothing, acetaminophen , NSAIDS)     Oxycodone  but out of medication 9. NEUROLOGIC SYMPTOMS: Do you have any weakness, numbness, or problems with bowel/bladder control?     N/a 10. OTHER SYMPTOMS: Do you have any other symptoms? (e.g., fever, abdomen pain, burning with urination, blood in urine)       N/a 11. PREGNANCY: Is there any chance you are pregnant? When was your last menstrual period?       N/a  Protocols used: Back Pain-[facility]

## 2023-10-22 NOTE — Telephone Encounter (Signed)
Copied from CRM [id number]. Topic: General - Other >> Oct 22, 2023  2:08 PM Clayton Bibles wrote: Reason for CRM: [facility] has not set up an appointment for [employer]. They said it would be March for the next appointment but they will not set the date of the appointment. They will not return his phone call. Please help with making the appointment. Please call her husband Hank at [phone] in the morning time due to work.

## 2023-10-30 ENCOUNTER — Other Ambulatory Visit: Payer: Self-pay | Admitting: Internal Medicine

## 2023-10-30 DIAGNOSIS — M961 Postlaminectomy syndrome, not elsewhere classified: Secondary | ICD-10-CM

## 2023-10-30 DIAGNOSIS — G894 Chronic pain syndrome: Secondary | ICD-10-CM

## 2023-10-30 MED ORDER — OXYCODONE-ACETAMINOPHEN 10-325 MG PO TABS: 1.0000 | ORAL_TABLET | Freq: Three times a day (TID) | ORAL | 0 refills | Status: DC | PRN

## 2023-10-30 NOTE — Telephone Encounter (Signed)
 Copied from CRM [id number]. Topic: Clinical - Medication Refill >> Oct 30, 2023 11:52 AM Carlatta H wrote: Most Recent Primary Care Visit:  Provider: Anabel Halon  Department: [facility]  Visit Type: NEW PATIENT  Date: 10/02/2023  Medication: oxyCODONE-acetaminophen (PERCOCET) 10-325 MG tablet [[id number]  Has the patient contacted their pharmacy? No (Agent: If no, request that the patient contact the pharmacy for the refill. If patient does not wish to contact the pharmacy document the reason why and proceed with request.) (Agent: If yes, when and what did the pharmacy advise?)  Is this the correct pharmacy for this prescription? NO If no, delete pharmacy and type the correct one.  This is the patient's preferred pharmacy:    [facility] [address] [place] - [address] [place] [address] [place] [address] Phone: [phone] Fax: [phone]   Has the prescription been filled recently? No  Is the patient out of the medication? Yes  Has the patient been seen for an appointment in the last year OR does the patient have an upcoming appointment? Yes  Can we respond through MyChart? No  Agent: Please be advised that Rx refills may take up to 3 business days. We ask that you follow-up with your pharmacy.

## 2023-11-05 ENCOUNTER — Ambulatory Visit: Payer: Medicare (Managed Care) | Admitting: Internal Medicine

## 2023-11-13 ENCOUNTER — Other Ambulatory Visit: Payer: Self-pay | Admitting: Internal Medicine

## 2023-11-13 DIAGNOSIS — G894 Chronic pain syndrome: Secondary | ICD-10-CM

## 2023-11-13 DIAGNOSIS — M961 Postlaminectomy syndrome, not elsewhere classified: Secondary | ICD-10-CM

## 2023-11-13 MED ORDER — OXYCODONE-ACETAMINOPHEN 10-325 MG PO TABS: 1.0000 | ORAL_TABLET | Freq: Three times a day (TID) | ORAL | 0 refills | Status: DC | PRN

## 2023-11-13 NOTE — Telephone Encounter (Signed)
 Copied from CRM [id number]. Topic: Clinical - Medication Refill >> Nov 13, 2023  1:22 PM Antwanette L wrote: Most Recent Primary Care Visit:  Provider: Anabel Halon  Department: [facility]  Visit Type: NEW PATIENT  Date: 10/02/2023  Medication: oxyCODONE-acetaminophen (PERCOCET) 10-325 MG tablet  Has the patient contacted their pharmacy? No  Is this the correct pharmacy for this prescription? Yes  [facility] [address] [place] - [address] [place] [address] Phone: [phone] Fax: [phone]   Has the prescription been filled recently? No  Is the patient out of the medication? No. Patient has 3 pills left   Has the patient been seen for an appointment in the last year OR does the patient have an upcoming appointment? Yes  Can we respond through MyChart? No. Patient can be reached by phone at [phone]. If the patient has to be contacted, please reached out on 3/14 in the morning. The patient husband is at work and currently has the phone.  Agent: Please be advised that Rx refills may take up to 3 business days. We ask that you follow-up with your pharmacy.

## 2023-12-05 ENCOUNTER — Ambulatory Visit: Payer: Medicare (Managed Care) | Admitting: Internal Medicine

## 2023-12-05 ENCOUNTER — Ambulatory Visit: Payer: Self-pay

## 2023-12-05 NOTE — Telephone Encounter (Signed)
 Chief Complaint: diarrhea  Symptoms: diarrhea after taking Miralax Frequency: since this AM Pertinent Negatives: Patient denies abd pain, vomiting, bloody stools, weakness, dizziness, dry mouth, dark urine  Disposition: [] ED /[] Urgent Care (no appt availability in office) / [x] Appointment(In office/virtual)/ []  [facility]/ [] Home Care/ [x] Refused Recommended Disposition /[] [facility]/ []  Follow-up with PCP Additional Notes: Husband reports pt has had diarrhea since with AM after taking Miralax for constipation. Pt had an appt this AM that she had to cancel d/t diarrhea. Pt denies abdominal pain, nausea, and vomiting. Denies bloody stools. Denies signs of dehydration like weakness, dizziness, dry mouth, dark urine, urinating less. Pt is still able to eat and drink normally. Pt rescheduled today's office visit for June. RN advised husband should be seen sooner for mgmt of constipation and diarrhea. No availability at the office until end of May. Per husband, pt would not want to be seen at a different office for these symptoms and added this has happened before. RN advised husband pt should go to the ED if she develops vomiting, bloody stools, weakness, or abdominal pain, and that he can call us back if she'd like to be scheduled at a different office for these symptoms in the meantime. Husband verbalized understanding.    Reason for Triage: Pt having diarrhea, had to cancel today's appointment.   Husband r/s for June, requested message be sent as FYI    Reason for Disposition  [1] MODERATE diarrhea (e.g., 4-6 times / day more than normal) AND [2] age > 70 years  Answer Assessment - Initial Assessment Questions 1. DIARRHEA SEVERITY: "How bad is the diarrhea?" "How many more stools have you had in the past 24 hours than normal?"    - NO DIARRHEA (SCALE 0)   - MILD (SCALE 1-3): Few loose or mushy BMs; increase of 1-3 stools over normal daily number of stools; mild increase  in ostomy output.   -  MODERATE (SCALE 4-7): Increase of 4-6 stools daily over normal; moderate increase in ostomy output.   -  SEVERE (SCALE 8-10; OR "WORST POSSIBLE"): Increase of 7 or more stools daily over normal; moderate increase in ostomy output; incontinence.     4x/24 hours 2. ONSET: "When did the diarrhea begin?"      Started earlier this AM 3. BM CONSISTENCY: "How loose or watery is the diarrhea?"      Watery, loose 4. VOMITING: "Are you also vomiting?" If Yes, ask: "How many times in the past 24 hours?"      No 5. ABDOMEN PAIN: "Are you having any abdomen pain?" If Yes, ask: "What does it feel like?" (e.g., crampy, dull, intermittent, constant)      No 6. ABDOMEN PAIN SEVERITY: If present, ask: "How bad is the pain?"  (e.g., Scale 1-10; mild, moderate, or severe)   - MILD (1-3): doesn't interfere with normal activities, abdomen soft and not tender to touch    - MODERATE (4-7): interferes with normal activities or awakens from sleep, abdomen tender to touch    - SEVERE (8-10): excruciating pain, doubled over, unable to do any normal activities       No 7. ORAL INTAKE: If vomiting, "Have you been able to drink liquids?" "How much liquids have you had in the past 24 hours?"     Able to eat and drink normally  8. HYDRATION: "Any signs of dehydration?" (e.g., dry mouth [not just dry lips], too weak to stand, dizziness, new weight loss) "When did you  last urinate?"     No 9. EXPOSURE: "Have you traveled to a foreign country recently?" "Have you been exposed to anyone with diarrhea?" "Could you have eaten any food that was spoiled?"     No  10. ANTIBIOTIC USE: "Are you taking antibiotics now or have you taken antibiotics in the past 2 months?"       No 11. OTHER SYMPTOMS: "Do you have any other symptoms?" (e.g., fever, blood in stool)       No - started after she took Miralax for constipation  Protocols used: [employer]

## 2023-12-31 ENCOUNTER — Telehealth: Payer: Self-pay | Admitting: Orthopedic Surgery

## 2023-12-31 NOTE — Telephone Encounter (Signed)
 Spoke w/the pt's spouse, the pt is having tailbone pain, went to [facility], no xrays, no surgery.  Will bring ED note then we can schedule, she wants to see Dr. Marvina Slough.

## 2024-01-09 ENCOUNTER — Ambulatory Visit: Payer: Medicare (Managed Care) | Admitting: Internal Medicine

## 2024-01-15 ENCOUNTER — Other Ambulatory Visit (INDEPENDENT_AMBULATORY_CARE_PROVIDER_SITE_OTHER): Payer: Medicare (Managed Care)

## 2024-01-15 ENCOUNTER — Ambulatory Visit: Payer: Medicare (Managed Care) | Admitting: Orthopedic Surgery

## 2024-01-15 ENCOUNTER — Encounter: Payer: Self-pay | Admitting: Orthopedic Surgery

## 2024-01-15 VITALS — BP 109/90 | HR 85 | Ht 59.0 in | Wt 145.0 lb

## 2024-01-15 DIAGNOSIS — M545 Low back pain, unspecified: Secondary | ICD-10-CM

## 2024-01-15 DIAGNOSIS — M25551 Pain in right hip: Secondary | ICD-10-CM

## 2024-01-15 DIAGNOSIS — M961 Postlaminectomy syndrome, not elsewhere classified: Secondary | ICD-10-CM

## 2024-01-15 NOTE — Progress Notes (Signed)
 Chief Complaint  Patient presents with   Back Pain    Right buttock    Hip Pain    Right hip /groin with walking     History of present illness  Samantha Carey is a 78 year old female status post 2 lumbar spine operations by Dr. Gwendlyn Lemmings years ago which she says did not work  She is followed by a chronic pain management specialist  Presents with a 2-1/2-week history of right buttock pain some groin pain and progressively worsening limp.  Is hard for her to stand up and she is concerned that she is leaning forward too much  Past Medical History:  Diagnosis Date   Anxiety    COPD (chronic obstructive pulmonary disease) (HCC)    Hypercholesteremia    Hypertension    Migraine     Current Outpatient Medications  Medication Instructions   albuterol  (VENTOLIN  HFA) 108 (90 Base) MCG/ACT inhaler 2 puffs, Inhalation, Every 6 hours PRN   atorvastatin  (LIPITOR) 80 mg, Daily   budesonide -formoterol  (SYMBICORT ) 160-4.5 MCG/ACT inhaler 2 puffs, Inhalation, Daily PRN   ezetimibe (ZETIA) 10 MG tablet 1 (ONE) TABLET DAILY FOR CHOLESTEROL   gabapentin  (NEURONTIN ) 800 mg, Oral, 3 times daily   ibuprofen  (ADVIL ) 800 mg, Oral, Every 8 hours PRN   oxyCODONE -acetaminophen  (PERCOCET) 10-325 MG tablet 1 tablet, Oral, Every 8 hours PRN   OxyCONTIN  20 mg, 2 times daily   triamterene-hydrochlorothiazide (MAXZIDE) 75-50 MG tablet 1 tablet, Daily   XTAMPZA  ER 18 MG C12A take 1 capsule by mouth twice a day with food   Physical Exam Vitals and nursing note reviewed.  Constitutional:      Appearance: Normal appearance.  HENT:     Head: Normocephalic and atraumatic.  Eyes:     General: No scleral icterus.       Right eye: No discharge.        Left eye: No discharge.     Extraocular Movements: Extraocular movements intact.     Conjunctiva/sclera: Conjunctivae normal.     Pupils: Pupils are equal, round, and reactive to light.  Cardiovascular:     Rate and Rhythm: Normal rate.     Pulses: Normal pulses.   Musculoskeletal:     Comments: Right hip exam  Leg lengths appear to be equal  Range of motion is normal we cannot reproduce the pain with ranging the hip  Back exam  Tenderness in the lower back especially on the right side Straight leg raise was negative  Skin:    General: Skin is warm and dry.     Capillary Refill: Capillary refill takes less than 2 seconds.  Neurological:     General: No focal deficit present.     Mental Status: She is alert and oriented to person, place, and time.     Gait: Gait abnormal.  Psychiatric:        Mood and Affect: Mood normal.        Behavior: Behavior normal.        Thought Content: Thought content normal.        Judgment: Judgment normal.    DG HIP UNILAT WITH PELVIS 2-3 VIEWS RIGHT Result Date: 01/15/2024 Right hip and pelvis Catching right hip buttock pain groin pain X-rays show normal femoral head contour minimal joint space narrowing bilaterally Small osteophyte slight decrease in head neck offset Overall fairly normal-appearing hip for 78 year old female Mild OA right hip   DG Lumbar Spine 2-3 Views Result Date: 01/15/2024 Lumbar spine History of 2 surgeries  Complains of catching left hip and lower back and buttock pain Abnormal coronal plane alignment multilevel osteophytes there is some disc space narrowing at 4 and 5 Severe arthritis of the facet joints Calcification of the arteries L3-4 spondylolisthesis grade 1 Severe spondylosis of the spine   Assessment and plan  Encounter Diagnoses  Name Primary?   Lumbar pain Yes   Pain in right hip    Failed back syndrome of lumbar spine    Acute right-sided low back pain, unspecified whether sciatica present    Unclear diagnosis at this point.  78 year old female with acute onset of lower back pain catching sensation after failed lumbar surgeries and chronic pain  Differential diagnosis Acute facet syndrome Foraminal stenosis Muscle strain  Recommend:  MRI lumbar spine assess  neural elements and then determine if physical therapy or spine referral is needed possible injections of steroids in the epidural region to assess diagnostically whether she is having radicular syndrome or not

## 2024-01-15 NOTE — Progress Notes (Signed)
  Intake history:  BP (!) 109/90   Pulse 85   Ht 4\' 11"  (1.499 m)   Wt 145 lb (65.8 kg)   BMI 29.29 kg/m  Body mass index is 29.29 kg/m.    WHAT ARE WE SEEING YOU FOR TODAY?   right hip(s) and Right buttock/ back   How long has this bothered you? (DOI?DOS?WS?)  2 and 1/2  week(s) ago  Anticoag.  No  Diabetes No  Heart disease No  Hypertension Yes  SMOKING HX No  Kidney disease No  Any ALLERGIES ______________________________________________   Treatment:  Have you taken:  Tylenol  Yes  Advil  Yes  Had PT Yes only one visit   Had injection No  Other  _________________________

## 2024-01-15 NOTE — Patient Instructions (Signed)
 While we are working on your approval for MRI please go ahead and call to schedule your appointment with [facility] within at least one (1) week.   Central Scheduling [phone]

## 2024-02-16 ENCOUNTER — Encounter: Payer: Self-pay | Admitting: Internal Medicine

## 2024-02-16 ENCOUNTER — Ambulatory Visit (INDEPENDENT_AMBULATORY_CARE_PROVIDER_SITE_OTHER): Payer: Medicare (Managed Care) | Admitting: Internal Medicine

## 2024-02-16 VITALS — BP 138/88 | HR 74 | Ht 59.0 in | Wt 144.0 lb

## 2024-02-16 DIAGNOSIS — J449 Chronic obstructive pulmonary disease, unspecified: Secondary | ICD-10-CM

## 2024-02-16 DIAGNOSIS — Z780 Asymptomatic menopausal state: Secondary | ICD-10-CM

## 2024-02-16 DIAGNOSIS — R7303 Prediabetes: Secondary | ICD-10-CM

## 2024-02-16 DIAGNOSIS — G894 Chronic pain syndrome: Secondary | ICD-10-CM

## 2024-02-16 DIAGNOSIS — Z23 Encounter for immunization: Secondary | ICD-10-CM | POA: Diagnosis not present

## 2024-02-16 DIAGNOSIS — I10 Essential (primary) hypertension: Secondary | ICD-10-CM

## 2024-02-16 DIAGNOSIS — E782 Mixed hyperlipidemia: Secondary | ICD-10-CM

## 2024-02-16 DIAGNOSIS — M961 Postlaminectomy syndrome, not elsewhere classified: Secondary | ICD-10-CM

## 2024-02-16 MED ORDER — ATORVASTATIN CALCIUM 80 MG PO TABS: 80.0000 mg | ORAL_TABLET | Freq: Every day | ORAL | 3 refills | Status: AC

## 2024-02-16 MED ORDER — GABAPENTIN 800 MG PO TABS: 800.0000 mg | ORAL_TABLET | Freq: Three times a day (TID) | ORAL | 5 refills | Status: DC

## 2024-02-16 MED ORDER — TRIAMTERENE-HCTZ 75-50 MG PO TABS
1.0000 | ORAL_TABLET | Freq: Every day | ORAL | 3 refills | Status: AC
Start: 2024-02-16 — End: ?

## 2024-02-16 MED ORDER — EZETIMIBE 10 MG PO TABS
10.0000 mg | ORAL_TABLET | Freq: Every day | ORAL | 3 refills | Status: AC
Start: 2024-02-16 — End: ?

## 2024-02-16 NOTE — Assessment & Plan Note (Signed)
 On atorvastatin 80 mg QD ?

## 2024-02-16 NOTE — Patient Instructions (Addendum)
 Please schedule AWV.  Please take Symbicort  regularly and Albuterol  as needed for shortness of breath or wheezing.  Please stop taking Ibuprofen .  Please continue to take medications as prescribed.  Please continue to follow low salt diet and ambulate as tolerated.

## 2024-02-16 NOTE — Assessment & Plan Note (Signed)
 BP Readings from Last 1 Encounters:  02/16/24 138/88   Well-controlled with Maxzide 75-50 mg QD Counseled for compliance with the medications Advised DASH diet and moderate exercise/walking as tolerated

## 2024-02-16 NOTE — Assessment & Plan Note (Signed)
 Overall well controlled Refilled Symbicort  Has albuterol  as needed for dyspnea or wheezing

## 2024-02-16 NOTE — Progress Notes (Signed)
 Established Patient Office Visit  Subjective:  Patient ID: Samantha Carey, female    DOB: 02/10/1946  Age: 78 y.o. MRN: [id number]  CC:  Chief Complaint  Patient presents with   Medical Management of Chronic Issues    1 month f/u    HPI Samantha Carey is a 78 y.o. female with past medical history of HTN, COPD, HLD and chronic pain syndrome who presents for f/u of her chronic medical conditions.  HTN: BP is well-controlled. Takes medications regularly. Patient denies headache, dizziness, chest pain, or palpitations.   COPD: She quit smoking in 2021. She has Symbicort  and PRN Albuterol .  She reports mild, intermittent dyspnea.  Denies any wheezing or hemoptysis.  HLD: She takes atorvastatin  80 mg once daily.   Chronic pain syndrome due to postlaminectomy syndrome: She has had lumbar spine surgeries X 2.  She continued to have chronic, severe low back pain, constant, radiating to bilateral LE with numbness and tingling of LE.  She was placed back on oxycodone  10 mg in the last visit instead of 20 mg that was prescribed by her previous PCP, and was referred to [facility] pain clinic.  She has been placed on oxycodone  20 mg BID as she had been in severe pain.  She is also on Xtampza  18 mg. She has not been able to ambulate much due to severe pain and weakness of LE.  She also takes gabapentin  300 mg 3 times daily, but reports worsening of radicular symptoms since decreasing dose.  Past Medical History:  Diagnosis Date   Anxiety    COPD (chronic obstructive pulmonary disease) (HCC)    Hypercholesteremia    Hypertension    Migraine     Past Surgical History:  Procedure Laterality Date   BACK SURGERY     x2   TOTAL ABDOMINAL HYSTERECTOMY     TRIGGER FINGER RELEASE Right 10/24/2016   Procedure: RIGHT RING FINGER TRIGGER RELEASE;  Surgeon: Darrin Emerald, MD;  Location: [facility];  Service: Orthopedics;  Laterality: Right;    Family History  Problem Relation Age of Onset   Stroke Mother     Diabetes Mother    Liver disease Father        alocholic liver cirrhosis   Liver cancer Father    Cancer Father    Stroke Maternal Grandmother    Diabetes Son     Social History   Socioeconomic History   Marital status: Married    Spouse name: Air cabin crew   Number of children: 3   Years of education: 8   Highest education level: Not on file  Occupational History   Occupation: disability  Tobacco Use   Smoking status: Former    Current packs/day: 0.00    Types: Cigarettes    Start date: 09/21/1996    Quit date: 09/21/2016    Years since quitting: 7.4   Smokeless tobacco: Never  Vaping Use   Vaping status: Never Used  Substance and Sexual Activity   Alcohol use: No    Alcohol/week: 0.0 standard drinks of alcohol   Drug use: No   Sexual activity: Yes    Birth control/protection: Surgical  Other Topics Concern   Not on file  Social History Narrative   Lives with husband Samantha Carey but recently moved in with daughter to help with ADLs   Drinks a small amount of caffeine daily   Social Drivers of Corporate investment banker Strain: Not on file  Food Insecurity: Not on file  Transportation Needs: Not on file  Physical Activity: Not on file  Stress: Not on file  Social Connections: Not on file  Intimate Partner Violence: Not on file    Outpatient Medications Prior to Visit  Medication Sig Dispense Refill   albuterol  (VENTOLIN  HFA) 108 (90 Base) MCG/ACT inhaler Inhale 2 puffs into the lungs every 6 (six) hours as needed for wheezing or shortness of breath. 18 g 3   budesonide -formoterol  (SYMBICORT ) 160-4.5 MCG/ACT inhaler Inhale 2 puffs into the lungs daily as needed (for shortness of breath). 1 each 5   OXYCONTIN  20 MG 12 hr tablet Take 20 mg by mouth 2 (two) times daily.     XTAMPZA  ER 18 MG C12A take 1 capsule by mouth twice a day with food     atorvastatin  (LIPITOR) 80 MG tablet Take 80 mg by mouth daily.      ezetimibe (ZETIA) 10 MG tablet 1 (ONE) TABLET DAILY FOR CHOLESTEROL      gabapentin  (NEURONTIN ) 800 MG tablet Take 1 tablet (800 mg total) by mouth 3 (three) times daily. 90 tablet 2   ibuprofen  (ADVIL ,MOTRIN ) 800 MG tablet Take 1 tablet (800 mg total) by mouth every 8 (eight) hours as needed. 90 tablet 1   oxyCODONE -acetaminophen  (PERCOCET) 10-325 MG tablet Take 1 tablet by mouth every 8 (eight) hours as needed for pain. 45 tablet 0   triamterene-hydrochlorothiazide (MAXZIDE) 75-50 MG tablet Take 1 tablet by mouth daily.      No facility-administered medications prior to visit.    Allergies  Allergen Reactions   Morphine And Codeine Nausea And Vomiting    ROS Review of Systems  Constitutional:  Negative for chills and fever.  HENT:  Negative for congestion, sinus pressure, sinus pain and sore throat.   Eyes:  Negative for pain and discharge.  Respiratory:  Negative for cough and shortness of breath.   Cardiovascular:  Negative for chest pain and palpitations.  Gastrointestinal:  Negative for abdominal pain, diarrhea, nausea and vomiting.  Endocrine: Negative for polydipsia and polyuria.  Genitourinary:  Negative for dysuria and hematuria.  Musculoskeletal:  Positive for back pain, gait problem, myalgias and neck pain. Negative for neck stiffness.  Skin:  Negative for rash.  Neurological:  Negative for dizziness and weakness.  Psychiatric/Behavioral:  Negative for agitation and behavioral problems.       Objective:    Physical Exam Vitals reviewed.  Constitutional:      General: She is not in acute distress.    Appearance: She is not diaphoretic.  HENT:     Head: Normocephalic and atraumatic.     Nose: Nose normal.     Mouth/Throat:     Mouth: Mucous membranes are moist.   Eyes:     General: No scleral icterus.    Extraocular Movements: Extraocular movements intact.    Cardiovascular:     Rate and Rhythm: Normal rate and regular rhythm.     Pulses: Normal pulses.     Heart sounds: No murmur heard. Pulmonary:     Breath sounds:  Normal breath sounds. No wheezing or rales.   Musculoskeletal:     Cervical back: Neck supple. No tenderness.     Lumbar back: Tenderness present. Decreased range of motion. Positive right straight leg raise test and positive left straight leg raise test.     Right lower leg: No edema.     Left lower leg: No edema.   Skin:    General: Skin is warm.  Findings: No rash.   Neurological:     General: No focal deficit present.     Mental Status: She is alert and oriented to person, place, and time.     Sensory: Sensory deficit (B/l LE) present.     Motor: Weakness (B/l LE - 4/5) present.     Gait: Gait abnormal.   Psychiatric:        Mood and Affect: Mood normal.        Behavior: Behavior normal.     BP 138/88 (BP Location: Left Arm)   Pulse 74   Ht 4' 11 (1.499 m)   Wt 144 lb (65.3 kg)   SpO2 93%   BMI 29.08 kg/m  Wt Readings from Last 3 Encounters:  02/16/24 144 lb (65.3 kg)  01/15/24 145 lb (65.8 kg)  10/02/23 142 lb 9.6 oz (64.7 kg)    Lab Results  Component Value Date   TSH 0.453 02/18/2019   Lab Results  Component Value Date   WBC 8.6 02/16/2024   HGB 12.4 02/16/2024   HCT 38.2 02/16/2024   MCV 91 02/16/2024   PLT 278 02/16/2024   Lab Results  Component Value Date   NA 140 02/16/2024   K 5.6 (H) 02/16/2024   CO2 18 (L) 02/16/2024   GLUCOSE 91 02/16/2024   BUN 26 02/16/2024   CREATININE 0.90 02/16/2024   BILITOT <0.2 02/16/2024   ALKPHOS 131 (H) 02/16/2024   AST 16 02/16/2024   ALT 15 02/16/2024   PROT 6.5 02/16/2024   ALBUMIN 4.2 02/16/2024   CALCIUM  9.2 02/16/2024   ANIONGAP 10 02/19/2019   EGFR 66 02/16/2024   Lab Results  Component Value Date   CHOL 228 (H) 02/16/2024   Lab Results  Component Value Date   HDL 43 02/16/2024   Lab Results  Component Value Date   LDLCALC 167 (H) 02/16/2024   Lab Results  Component Value Date   TRIG 103 02/16/2024   Lab Results  Component Value Date   CHOLHDL 5.3 (H) 02/16/2024   Lab Results   Component Value Date   HGBA1C 5.5 02/16/2024      Assessment & Plan:   Problem List Items Addressed This Visit       Cardiovascular and Mediastinum   HTN (hypertension) - Primary   BP Readings from Last 1 Encounters:  02/16/24 138/88   Well-controlled with Maxzide 75-50 mg QD Counseled for compliance with the medications Advised DASH diet and moderate exercise/walking as tolerated       Relevant Medications   atorvastatin  (LIPITOR) 80 MG tablet   triamterene-hydrochlorothiazide (MAXZIDE) 75-50 MG tablet   ezetimibe (ZETIA) 10 MG tablet   Other Relevant Orders   CMP14+EGFR (Completed)   CBC with Differential/Platelet (Completed)   TSH + free T4     Respiratory   COPD (chronic obstructive pulmonary disease) (HCC)   Overall well controlled Refilled Symbicort  Has albuterol  as needed for dyspnea or wheezing        Other   Chronic pain syndrome   Has a history of 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, s/p lumbar spine surgeries X 2 around 2019 Used to follow-up with [facility] surgery Was on chronic opioids -oxycodone  20 mg 4 times daily, prescribed by her previous PCP Considering her severe pain and lack of mobility due to it, has been placed on oxycodone  20 mg BID and Xtampza  18 mg by pain clinic Increased dose of gabapentin  to 800 mg 3 times daily, used to  take 800 mg 5 times daily in the past, was recently given 300 mg by pain clinic and was told to get further refills from PCP Checked urine tox assure Followed by [place] pain clinic      Relevant Medications   gabapentin  (NEURONTIN ) 800 MG tablet   Postlaminectomy syndrome of lumbar region   Relevant Medications   gabapentin  (NEURONTIN ) 800 MG tablet   Mixed hyperlipidemia   On atorvastatin  80 mg QD      Relevant Medications   atorvastatin  (LIPITOR) 80 MG tablet   triamterene-hydrochlorothiazide (MAXZIDE) 75-50 MG tablet   ezetimibe (ZETIA) 10 MG tablet   Other Relevant  Orders   Lipid Profile (Completed)   Other Visit Diagnoses       Post-menopausal       Relevant Orders   DG Bone Density     Prediabetes       Relevant Orders   Hemoglobin A1c (Completed)     Encounter for immunization       Relevant Orders   Pneumococcal conjugate vaccine 20-valent (Completed)       Meds ordered this encounter  Medications   gabapentin  (NEURONTIN ) 800 MG tablet    Sig: Take 1 tablet (800 mg total) by mouth 3 (three) times daily.    Dispense:  90 tablet    Refill:  5   atorvastatin  (LIPITOR) 80 MG tablet    Sig: Take 1 tablet (80 mg total) by mouth daily.    Dispense:  90 tablet    Refill:  3   triamterene-hydrochlorothiazide (MAXZIDE) 75-50 MG tablet    Sig: Take 1 tablet by mouth daily.    Dispense:  90 tablet    Refill:  3   ezetimibe (ZETIA) 10 MG tablet    Sig: Take 1 tablet (10 mg total) by mouth daily.    Dispense:  90 tablet    Refill:  3    Follow-up: Return in about 6 months (around 08/17/2024) for HTN and COPD.    Meldon Sport, MD

## 2024-02-17 ENCOUNTER — Ambulatory Visit: Payer: Self-pay | Admitting: Internal Medicine

## 2024-02-17 LAB — CBC WITH DIFFERENTIAL/PLATELET
Basophils Absolute: 0 10*3/uL (ref 0.0–0.2)
Basos: 1 %
EOS (ABSOLUTE): 0.6 10*3/uL — ABNORMAL HIGH (ref 0.0–0.4)
Eos: 7 %
Hematocrit: 38.2 % (ref 34.0–46.6)
Hemoglobin: 12.4 g/dL (ref 11.1–15.9)
Immature Grans (Abs): 0 10*3/uL (ref 0.0–0.1)
Immature Granulocytes: 0 %
Lymphocytes Absolute: 2.8 10*3/uL (ref 0.7–3.1)
Lymphs: 32 %
MCH: 29.4 pg (ref 26.6–33.0)
MCHC: 32.5 g/dL (ref 31.5–35.7)
MCV: 91 fL (ref 79–97)
Monocytes Absolute: 0.6 10*3/uL (ref 0.1–0.9)
Monocytes: 7 %
Neutrophils Absolute: 4.6 10*3/uL (ref 1.4–7.0)
Neutrophils: 53 %
Platelets: 278 10*3/uL (ref 150–450)
RBC: 4.22 x10E6/uL (ref 3.77–5.28)
RDW: 12.7 % (ref 11.7–15.4)
WBC: 8.6 10*3/uL (ref 3.4–10.8)

## 2024-02-17 LAB — HEMOGLOBIN A1C
Est. average glucose Bld gHb Est-mCnc: 111 mg/dL
Hgb A1c MFr Bld: 5.5 % (ref 4.8–5.6)

## 2024-02-17 LAB — LIPID PANEL
Chol/HDL Ratio: 5.3 ratio — ABNORMAL HIGH (ref 0.0–4.4)
Cholesterol, Total: 228 mg/dL — ABNORMAL HIGH (ref 100–199)
HDL: 43 mg/dL (ref >39)
LDL Chol Calc (NIH): 167 mg/dL — ABNORMAL HIGH (ref 0–99)
Triglycerides: 103 mg/dL (ref 0–149)
VLDL Cholesterol Cal: 18 mg/dL (ref 5–40)

## 2024-02-17 LAB — CMP14+EGFR
ALT: 15 IU/L (ref 0–32)
AST: 16 IU/L (ref 0–40)
Albumin: 4.2 g/dL (ref 3.8–4.8)
Alkaline Phosphatase: 131 IU/L — ABNORMAL HIGH (ref 44–121)
BUN/Creatinine Ratio: 29 — ABNORMAL HIGH (ref 12–28)
BUN: 26 mg/dL (ref 8–27)
Bilirubin Total: <0.2 mg/dL (ref 0.0–1.2)
CO2: 18 mmol/L — ABNORMAL LOW (ref 20–29)
Calcium: 9.2 mg/dL (ref 8.7–10.3)
Chloride: 107 mmol/L — ABNORMAL HIGH (ref 96–106)
Creatinine, Ser: 0.9 mg/dL (ref 0.57–1.00)
Globulin, Total: 2.3 g/dL (ref 1.5–4.5)
Glucose: 91 mg/dL (ref 70–99)
Potassium: 5.6 mmol/L — ABNORMAL HIGH (ref 3.5–5.2)
Sodium: 140 mmol/L (ref 134–144)
Total Protein: 6.5 g/dL (ref 6.0–8.5)
eGFR: 66 mL/min/{1.73_m2} (ref >59)

## 2024-02-19 ENCOUNTER — Telehealth: Payer: Self-pay

## 2024-02-19 NOTE — Telephone Encounter (Signed)
 Copied from CRM [id number]. Topic: Clinical - Lab/Test Results >> Feb 19, 2024 10:00 AM Ivette P wrote: Reason for CRM: PT husband call in and read results as follows   Meldon Sport, MD to [facility] Clinical     02/17/24  7:53 AM Result Note Please advise her that her cholesterol level is very high, needs to start taking atorvastatin  and ezetimibe regularly.  Her potassium level was elevated, which will be rechecked later.  She needs to maintain at least 64 ounces of fluid intake in a day.   Did not have any further questions. Hank understood.

## 2024-02-19 NOTE — Assessment & Plan Note (Signed)
 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

## 2024-06-23 ENCOUNTER — Ambulatory Visit: Payer: Medicare (Managed Care)

## 2024-08-17 ENCOUNTER — Encounter: Payer: Self-pay | Admitting: Internal Medicine

## 2024-08-17 ENCOUNTER — Ambulatory Visit: Payer: Medicare (Managed Care) | Admitting: Internal Medicine

## 2024-08-17 VITALS — BP 123/77 | HR 82 | Ht 59.0 in | Wt 145.8 lb

## 2024-08-17 DIAGNOSIS — J449 Chronic obstructive pulmonary disease, unspecified: Secondary | ICD-10-CM

## 2024-08-17 DIAGNOSIS — I10 Essential (primary) hypertension: Secondary | ICD-10-CM

## 2024-08-17 DIAGNOSIS — E782 Mixed hyperlipidemia: Secondary | ICD-10-CM

## 2024-08-17 DIAGNOSIS — M961 Postlaminectomy syndrome, not elsewhere classified: Secondary | ICD-10-CM

## 2024-08-17 DIAGNOSIS — Z0001 Encounter for general adult medical examination with abnormal findings: Secondary | ICD-10-CM | POA: Insufficient documentation

## 2024-08-17 MED ORDER — BUDESONIDE-FORMOTEROL FUMARATE 160-4.5 MCG/ACT IN AERO: 2.0000 | INHALATION_SPRAY | Freq: Every day | RESPIRATORY_TRACT | 5 refills | Status: AC | PRN

## 2024-08-17 MED ORDER — GABAPENTIN 800 MG PO TABS: 800.0000 mg | ORAL_TABLET | Freq: Three times a day (TID) | ORAL | 5 refills | Status: AC

## 2024-08-17 NOTE — Assessment & Plan Note (Addendum)
 On atorvastatin  80 mg QD Last lipid profile showed significantly elevated LDL as she had run out of her atorvastatin , but now takes it regularly

## 2024-08-17 NOTE — Assessment & Plan Note (Signed)
 BP Readings from Last 1 Encounters:  08/17/24 123/77   Well-controlled with Maxzide 75-50 mg QD Counseled for compliance with the medications Advised DASH diet and moderate exercise/walking as tolerated

## 2024-08-17 NOTE — Patient Instructions (Addendum)
 Please schedule bone density.  Please continue to take medications as prescribed.  Please continue to follow low salt diet and perform moderate exercise/walking as tolerated.  Please consider getting Shingrix vaccine at local pharmacy.

## 2024-08-17 NOTE — Assessment & Plan Note (Signed)
 Overall well controlled Continue Symbicort  Has albuterol  as needed for dyspnea or wheezing

## 2024-08-17 NOTE — Assessment & Plan Note (Signed)
Physical exam as documented. Fasting blood tests today. Advised to get Shingrix vaccines at local pharmacy.

## 2024-08-17 NOTE — Progress Notes (Signed)
 Established Patient Office Visit  Subjective:  Patient ID: Samantha Carey, female    DOB: 09/27/1945  Age: 78 y.o. MRN: [id number]  CC:  Chief Complaint  Patient presents with   Follow-up    Follow up     HPI Samantha Carey is a 78 y.o. female with past medical history of HTN, COPD, HLD and chronic pain syndrome who presents for f/u of her chronic medical conditions.  HTN: BP is well-controlled. Takes medications regularly. Patient denies headache, dizziness, chest pain, or palpitations.   COPD: She quit smoking in 2021. She has Symbicort  and PRN Albuterol .  She reports mild, intermittent dyspnea.  Denies any wheezing or hemoptysis.  HLD: She takes atorvastatin  80 mg once daily.   Chronic pain syndrome due to postlaminectomy syndrome: She has had lumbar spine surgeries X 2.  She continued to have chronic, severe low back pain, constant, radiating to bilateral LE with numbness and tingling of LE. She has been placed on oxycodone  20 mg BID by pain clinic as she had been in severe pain.  She is also on Xtampza  18 mg. She also takes gabapentin  800 mg 3 times daily, reports improvement of radicular symptoms since increasing dose in the last visit.  Past Medical History:  Diagnosis Date   Anxiety    COPD (chronic obstructive pulmonary disease) (HCC)    Hypercholesteremia    Hypertension    Migraine     Past Surgical History:  Procedure Laterality Date   BACK SURGERY     x2   TOTAL ABDOMINAL HYSTERECTOMY     TRIGGER FINGER RELEASE Right 10/24/2016   Procedure: RIGHT RING FINGER TRIGGER RELEASE;  Surgeon: Taft FORBES Minerva, MD;  Location: [facility];  Service: Orthopedics;  Laterality: Right;    Family History  Problem Relation Age of Onset   Stroke Mother    Diabetes Mother    Liver disease Father        alocholic liver cirrhosis   Liver cancer Father    Cancer Father    Stroke Maternal Grandmother    Diabetes Son     Social History   Socioeconomic History   Marital status:  Married    Spouse name: [employer]   Number of children: 3   Years of education: 8   Highest education level: Not on file  Occupational History   Occupation: disability  Tobacco Use   Smoking status: Former    Current packs/day: 0.00    Types: Cigarettes    Start date: 09/21/1996    Quit date: 09/21/2016    Years since quitting: 7.9   Smokeless tobacco: Never  Vaping Use   Vaping status: Never Used  Substance and Sexual Activity   Alcohol use: No    Alcohol/week: 0.0 standard drinks of alcohol   Drug use: No   Sexual activity: Yes    Birth control/protection: Surgical  Other Topics Concern   Not on file  Social History Narrative   Lives with husband Hank but recently moved in with daughter to help with ADLs   Drinks a small amount of caffeine daily   Social Drivers of Health   Tobacco Use: Medium Risk (08/17/2024)   Patient History    Smoking Tobacco Use: Former    Smokeless Tobacco Use: Never    Passive Exposure: Not on Actuary Strain: Not on file  Food Insecurity: Not on file  Transportation Needs: Not on file  Physical Activity: Not on file  Stress: Not on file  Social Connections: Not on file  Intimate Partner Violence: Not on file  Depression [id number]): Low Risk (08/17/2024)   Depression (PHQ2-9)    PHQ-2 Score: 0  Alcohol Screen: Not on file  Housing: Not on file  Utilities: Not on file  Health Literacy: Not on file    Outpatient Medications Prior to Visit  Medication Sig Dispense Refill   albuterol  (VENTOLIN  HFA) 108 (90 Base) MCG/ACT inhaler Inhale 2 puffs into the lungs every 6 (six) hours as needed for wheezing or shortness of breath. 18 g 3   atorvastatin  (LIPITOR) 80 MG tablet Take 1 tablet (80 mg total) by mouth daily. 90 tablet 3   ezetimibe  (ZETIA ) 10 MG tablet Take 1 tablet (10 mg total) by mouth daily. 90 tablet 3   OXYCONTIN  20 MG 12 hr tablet Take 20 mg by mouth 2 (two) times daily.     triamterene -hydrochlorothiazide (MAXZIDE)  75-50 MG tablet Take 1 tablet by mouth daily. 90 tablet 3   XTAMPZA  ER 18 MG C12A take 1 capsule by mouth twice a day with food     budesonide -formoterol  (SYMBICORT ) 160-4.5 MCG/ACT inhaler Inhale 2 puffs into the lungs daily as needed (for shortness of breath). 1 each 5   gabapentin  (NEURONTIN ) 800 MG tablet Take 1 tablet (800 mg total) by mouth 3 (three) times daily. 90 tablet 5   No facility-administered medications prior to visit.    Allergies  Allergen Reactions   Morphine And Codeine Nausea And Vomiting    ROS Review of Systems  Constitutional:  Negative for chills and fever.  HENT:  Negative for congestion, sinus pressure, sinus pain and sore throat.   Eyes:  Negative for pain and discharge.  Respiratory:  Negative for cough and shortness of breath.   Cardiovascular:  Negative for chest pain and palpitations.  Gastrointestinal:  Negative for abdominal pain, diarrhea, nausea and vomiting.  Endocrine: Negative for polydipsia and polyuria.  Genitourinary:  Negative for dysuria and hematuria.  Musculoskeletal:  Positive for back pain, gait problem, myalgias and neck pain. Negative for neck stiffness.  Skin:  Negative for rash.  Neurological:  Negative for dizziness and weakness.  Psychiatric/Behavioral:  Negative for agitation and behavioral problems.       Objective:    Physical Exam Vitals reviewed.  Constitutional:      General: She is not in acute distress.    Appearance: She is not diaphoretic.  HENT:     Head: Normocephalic and atraumatic.     Nose: Nose normal.     Mouth/Throat:     Mouth: Mucous membranes are moist.  Eyes:     General: No scleral icterus.    Extraocular Movements: Extraocular movements intact.  Cardiovascular:     Rate and Rhythm: Normal rate and regular rhythm.     Heart sounds: Normal heart sounds. No murmur heard. Pulmonary:     Breath sounds: Normal breath sounds. No wheezing or rales.  Abdominal:     Palpations: Abdomen is soft.      Tenderness: There is no abdominal tenderness.  Musculoskeletal:     Cervical back: Neck supple. No tenderness.     Lumbar back: Tenderness present. Decreased range of motion. Positive right straight leg raise test and positive left straight leg raise test.     Right lower leg: No edema.     Left lower leg: No edema.  Skin:    General: Skin is warm.     Findings: No rash.  Neurological:  General: No focal deficit present.     Mental Status: She is alert and oriented to person, place, and time.     Sensory: Sensory deficit (B/l LE) present.     Motor: Weakness (B/l LE - 4/5) present.     Gait: Gait abnormal.  Psychiatric:        Mood and Affect: Mood normal.        Behavior: Behavior normal.     BP 123/77   Pulse 82   Ht 4' 11 (1.499 m)   Wt 145 lb 12.8 oz (66.1 kg)   SpO2 95%   BMI 29.45 kg/m  Wt Readings from Last 3 Encounters:  08/17/24 145 lb 12.8 oz (66.1 kg)  02/16/24 144 lb (65.3 kg)  01/15/24 145 lb (65.8 kg)    Lab Results  Component Value Date   TSH 0.453 02/18/2019   Lab Results  Component Value Date   WBC 8.6 02/16/2024   HGB 12.4 02/16/2024   HCT 38.2 02/16/2024   MCV 91 02/16/2024   PLT 278 02/16/2024   Lab Results  Component Value Date   NA 140 02/16/2024   K 5.6 (H) 02/16/2024   CO2 18 (L) 02/16/2024   GLUCOSE 91 02/16/2024   BUN 26 02/16/2024   CREATININE 0.90 02/16/2024   BILITOT <0.2 02/16/2024   ALKPHOS 131 (H) 02/16/2024   AST 16 02/16/2024   ALT 15 02/16/2024   PROT 6.5 02/16/2024   ALBUMIN 4.2 02/16/2024   CALCIUM  9.2 02/16/2024   ANIONGAP 10 02/19/2019   EGFR 66 02/16/2024   Lab Results  Component Value Date   CHOL 228 (H) 02/16/2024   Lab Results  Component Value Date   HDL 43 02/16/2024   Lab Results  Component Value Date   LDLCALC 167 (H) 02/16/2024   Lab Results  Component Value Date   TRIG 103 02/16/2024   Lab Results  Component Value Date   CHOLHDL 5.3 (H) 02/16/2024   Lab Results  Component Value  Date   HGBA1C 5.5 02/16/2024      Assessment & Plan:   Problem List Items Addressed This Visit       Cardiovascular and Mediastinum   HTN (hypertension)   BP Readings from Last 1 Encounters:  08/17/24 123/77   Well-controlled with Maxzide 75-50 mg QD Counseled for compliance with the medications Advised DASH diet and moderate exercise/walking as tolerated       Relevant Orders   CMP14+EGFR   TSH + free T4     Respiratory   COPD (chronic obstructive pulmonary disease) (HCC)   Overall well controlled Continue Symbicort  Has albuterol  as needed for dyspnea or wheezing      Relevant Medications   budesonide -formoterol  (SYMBICORT ) 160-4.5 MCG/ACT inhaler     Other   Postlaminectomy syndrome of lumbar region   Has a history of 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, s/p lumbar spine surgeries X 2 around 2019 Used to follow-up with [facility] On chronic opioids and gabapentin       Relevant Medications   gabapentin  (NEURONTIN ) 800 MG tablet   Mixed hyperlipidemia   On atorvastatin  80 mg QD Last lipid profile showed significantly elevated LDL as she had run out of her atorvastatin , but now takes it regularly      Relevant Orders   Lipid Profile   Encounter for general adult medical examination with abnormal findings - Primary   Physical exam as documented. Fasting blood tests today. Advised to get Shingrix  vaccines at local pharmacy.        Meds ordered this encounter  Medications   budesonide -formoterol  (SYMBICORT ) 160-4.5 MCG/ACT inhaler    Sig: Inhale 2 puffs into the lungs daily as needed (for shortness of breath).    Dispense:  1 each    Refill:  5   gabapentin  (NEURONTIN ) 800 MG tablet    Sig: Take 1 tablet (800 mg total) by mouth 3 (three) times daily.    Dispense:  90 tablet    Refill:  5    Follow-up: Return in about 6 months (around 02/15/2025) for HTN and COPD.    Suzzane MARLA Blanch, MD

## 2024-08-17 NOTE — Assessment & Plan Note (Deleted)
 Has a history of 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, s/p lumbar spine surgeries X 2 around 2019 Used to follow-up with [facility] Considering her severe pain and lack of mobility due to it, has been placed on oxycodone  20 mg BID and Xtampza  18 mg by pain clinic Increased dose of gabapentin  to 800 mg 3 times daily, used to take 800 mg 5 times daily in the past Checked urine tox assure Followed by [facility]

## 2024-08-17 NOTE — Assessment & Plan Note (Signed)
 Has a history of 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, s/p lumbar spine surgeries X 2 around 2019 Used to follow-up with [facility] On chronic opioids and gabapentin

## 2024-08-18 ENCOUNTER — Ambulatory Visit: Payer: Self-pay | Admitting: Internal Medicine

## 2024-08-18 LAB — CMP14+EGFR
ALT: 20 IU/L (ref 0–32)
AST: 21 IU/L (ref 0–40)
Albumin: 4.1 g/dL (ref 3.8–4.8)
Alkaline Phosphatase: 118 IU/L (ref 49–135)
BUN/Creatinine Ratio: 24 (ref 12–28)
BUN: 21 mg/dL (ref 8–27)
Bilirubin Total: 0.2 mg/dL (ref 0.0–1.2)
CO2: 23 mmol/L (ref 20–29)
Calcium: 9.6 mg/dL (ref 8.7–10.3)
Chloride: 105 mmol/L (ref 96–106)
Creatinine, Ser: 0.89 mg/dL (ref 0.57–1.00)
Globulin, Total: 2.1 g/dL (ref 1.5–4.5)
Glucose: 84 mg/dL (ref 70–99)
Potassium: 4.6 mmol/L (ref 3.5–5.2)
Sodium: 142 mmol/L (ref 134–144)
Total Protein: 6.2 g/dL (ref 6.0–8.5)
eGFR: 67 mL/min/1.73 (ref >59)

## 2024-08-18 LAB — LIPID PANEL
Chol/HDL Ratio: 4.5 ratio — ABNORMAL HIGH (ref 0.0–4.4)
Cholesterol, Total: 168 mg/dL (ref 100–199)
HDL: 37 mg/dL — ABNORMAL LOW (ref >39)
LDL Chol Calc (NIH): 104 mg/dL — ABNORMAL HIGH (ref 0–99)
Triglycerides: 154 mg/dL — ABNORMAL HIGH (ref 0–149)
VLDL Cholesterol Cal: 27 mg/dL (ref 5–40)

## 2024-08-18 LAB — TSH+FREE T4
Free T4: 1.23 ng/dL (ref 0.82–1.77)
TSH: 1.89 u[IU]/mL (ref 0.450–4.500)

## 2024-09-28 ENCOUNTER — Inpatient Hospital Stay (HOSPITAL_COMMUNITY): Admission: RE | Admit: 2024-09-28 | Payer: Medicare (Managed Care) | Source: Ambulatory Visit

## 2024-10-08 ENCOUNTER — Ambulatory Visit: Payer: Self-pay

## 2024-10-11 ENCOUNTER — Ambulatory Visit: Payer: Self-pay

## 2025-02-15 ENCOUNTER — Ambulatory Visit: Payer: Self-pay | Admitting: Internal Medicine
